# Patient Record
Sex: Male | Born: 1985 | Race: White | Hispanic: No | Marital: Married | Smoking: Never smoker
Health system: Southern US, Community
[De-identification: ages and names within clinical notes are randomized; demographics above are authoritative.]

## PROBLEM LIST (undated history)

## (undated) HISTORY — PX: WISDOM TOOTH EXTRACTION: SHX21

## (undated) HISTORY — PX: HERNIA REPAIR: SHX51

---

## 2012-07-13 ENCOUNTER — Emergency Department (HOSPITAL_COMMUNITY): Payer: BC Managed Care – PPO

## 2012-07-13 ENCOUNTER — Emergency Department (HOSPITAL_COMMUNITY)
Admission: EM | Admit: 2012-07-13 | Discharge: 2012-07-13 | Disposition: A | Discharge status: Home / Self Care | Payer: BC Managed Care – PPO | Attending: Emergency Medicine | Admitting: Emergency Medicine

## 2012-07-13 ENCOUNTER — Encounter (HOSPITAL_COMMUNITY): Payer: Self-pay | Admitting: *Deleted

## 2012-07-13 DIAGNOSIS — M940 Chondrocostal junction syndrome [Tietze]: Secondary | ICD-10-CM | POA: Insufficient documentation

## 2012-07-13 DIAGNOSIS — R197 Diarrhea, unspecified: Secondary | ICD-10-CM | POA: Insufficient documentation

## 2012-07-13 LAB — POCT I-STAT, CHEM 8
Creatinine, Ser: 1.2 mg/dL (ref 0.50–1.35)
Glucose, Bld: 107 mg/dL — ABNORMAL HIGH (ref 70–99)
Hemoglobin: 15.6 g/dL (ref 13.0–17.0)
Sodium: 139 mEq/L (ref 135–145)
TCO2: 29 mmol/L (ref 0–100)

## 2012-07-13 MED ORDER — METHOCARBAMOL 500 MG PO TABS: 500.0000 mg | ORAL_TABLET | Freq: Two times a day (BID) | ORAL | Status: DC | PRN

## 2012-07-13 NOTE — ED Provider Notes (Signed)
Medical screening examination/treatment/procedure(s) were performed by non-physician practitioner and as supervising physician I was immediately available for consultation/collaboration.   Darvis Croft, MD 07/13/12 2345 

## 2012-07-13 NOTE — ED Notes (Signed)
Pt c/o left sided rib pain since Saturday. Denies any injury, states he just woke up with pain. Movement and deep inspiration makes pain worse. States nothing makes pain better. Pt states that pain is not worse with palpation and does not radiate.

## 2012-07-13 NOTE — ED Provider Notes (Signed)
History     CSN: [id number]  Arrival date & time 07/13/12  2009   First MD Initiated Contact with Patient 07/13/12 2116      Chief Complaint  Patient presents with  . Rib pain     (Consider location/radiation/quality/duration/timing/severity/associated sxs/prior treatment) HPI Comments: This is a 27 year old patient who presents today with sudden onset non radiating rib pain since Saturday. He does not recall any trauma or any extra lifting. The pain is a dull pain when he does not move. It is a sharper pain when he breaths in. He has had some loose stools recently without blood. He denies any other recent illness. Denies SOB, nausea, abdominal pain, weakness, or diaphoresis. No recent long trips, no leg swelling. He has never had pain like this before.   The history is provided by the patient. No language interpreter was used.    History reviewed. No pertinent past medical history.  Past Surgical History  Procedure Laterality Date  . Wisdom tooth extraction      History reviewed. No pertinent family history.  History  Substance Use Topics  . Smoking status: Never Smoker   . Smokeless tobacco: Not on file  . Alcohol Use: No      Review of Systems  Constitutional: Negative for fever and chills.  Respiratory: Negative for chest tightness and shortness of breath.   Cardiovascular: Negative for chest pain.  Gastrointestinal: Positive for diarrhea ("a couple loose stools"). Negative for nausea, vomiting, abdominal pain and constipation.    Allergies  Review of patient's allergies indicates no known allergies.  Home Medications  No current outpatient prescriptions on file.  BP 148/72  Pulse 73  Temp(Src) 97.5 F (36.4 C) (Oral)  Resp 12  SpO2 100%  Physical Exam  Nursing note and vitals reviewed. Constitutional: He is oriented to person, place, and time. He appears well-developed and well-nourished. No distress.  HENT:  Head: Normocephalic and atraumatic.   Right Ear: External ear normal.  Left Ear: External ear normal.  Nose: Nose normal.  Eyes: Conjunctivae are normal.  Neck: Normal range of motion. No tracheal deviation present.  Cardiovascular: Normal rate, regular rhythm, normal heart sounds, intact distal pulses and normal pulses.   Pulmonary/Chest: Effort normal and breath sounds normal. No stridor. He exhibits no mass, no tenderness, no deformity and no swelling.  Abdominal: Soft. He exhibits no distension. There is no tenderness.  Musculoskeletal: Normal range of motion.  Neurological: He is alert and oriented to person, place, and time.  Skin: Skin is warm and dry. He is not diaphoretic.  Psychiatric: He has a normal mood and affect. His behavior is normal.    ED Course  Procedures (including critical care time)  Labs Reviewed  POCT I-STAT, CHEM 8 - Abnormal; Notable for the following:    BUN 28 (*)    Glucose, Bld 107 (*)    All other components within normal limits  POCT I-STAT TROPONIN I   Dg Chest 2 View  07/13/2012  *RADIOLOGY REPORT*  Clinical Data: Left-sided chest pain.  CHEST - 2 VIEW  Comparison: None.  Findings: The heart size is normal.  The lungs are clear.  A pectus excavatum deformity is evident.  There is slight curvature of the thoracic spine.  IMPRESSION: No acute cardiopulmonary disease.   Original Report Authenticated By: Marin Roberts, M.D.     Date: 07/13/2012  Rate: 64  Rhythm: normal sinus rhythm  QRS Axis: normal  Intervals: normal  ST/T Wave abnormalities:  normal  Conduction Disutrbances:none  Narrative Interpretation:   Old EKG Reviewed: none available   1. Acute costochondritis       MDM  Patient came in present with rib pain. Dull pain with pleuritic qualities. PERC negative. Vital signs WNL - no tachycardia. CXR shows no acute process. EKG normal. Troponin negative. O2 sats remained 100% on RA through ED course. Suspect costochondritis. Return instructions given. Patient will  follow up with PCP. Patient / Family / Caregiver informed of clinical course, understand medical decision-making process, and agree with plan.        Mora Bellman, PA-C 07/13/12 2216

## 2012-07-13 NOTE — ED Notes (Addendum)
TRIAGE BY CHRISTA RN

## 2012-07-13 NOTE — ED Notes (Signed)
Pt c/o left sided rib pain since Saturday.  Also c/o diarrhea.  Denies SOB, nausea, weakness, diaphoresis.

## 2013-10-15 ENCOUNTER — Emergency Department (HOSPITAL_BASED_OUTPATIENT_CLINIC_OR_DEPARTMENT_OTHER)
Admission: EM | Admit: 2013-10-15 | Discharge: 2013-10-15 | Disposition: A | Discharge status: Home / Self Care | Payer: BC Managed Care – PPO | Attending: Emergency Medicine | Admitting: Emergency Medicine

## 2013-10-15 ENCOUNTER — Encounter (HOSPITAL_BASED_OUTPATIENT_CLINIC_OR_DEPARTMENT_OTHER): Payer: Self-pay | Admitting: Emergency Medicine

## 2013-10-15 DIAGNOSIS — R55 Syncope and collapse: Secondary | ICD-10-CM | POA: Insufficient documentation

## 2013-10-15 LAB — COMPREHENSIVE METABOLIC PANEL
ALK PHOS: 74 U/L (ref 39–117)
ALT: 17 U/L (ref 0–53)
ANION GAP: 13 (ref 5–15)
AST: 15 U/L (ref 0–37)
Albumin: 3.9 g/dL (ref 3.5–5.2)
BILIRUBIN TOTAL: 0.3 mg/dL (ref 0.3–1.2)
BUN: 21 mg/dL (ref 6–23)
CHLORIDE: 103 meq/L (ref 96–112)
CO2: 26 meq/L (ref 19–32)
Calcium: 9.1 mg/dL (ref 8.4–10.5)
Creatinine, Ser: 1.2 mg/dL (ref 0.50–1.35)
GFR calc non Af Amer: 80 mL/min — ABNORMAL LOW (ref >90)
GLUCOSE: 103 mg/dL — AB (ref 70–99)
POTASSIUM: 4.2 meq/L (ref 3.7–5.3)
SODIUM: 142 meq/L (ref 137–147)
TOTAL PROTEIN: 7.1 g/dL (ref 6.0–8.3)

## 2013-10-15 LAB — CBC WITH DIFFERENTIAL/PLATELET
Basophils Absolute: 0 10*3/uL (ref 0.0–0.1)
Basophils Relative: 0 % (ref 0–1)
EOS ABS: 0 10*3/uL (ref 0.0–0.7)
Eosinophils Relative: 0 % (ref 0–5)
HCT: 43.4 % (ref 39.0–52.0)
HEMOGLOBIN: 14.9 g/dL (ref 13.0–17.0)
LYMPHS ABS: 1.7 10*3/uL (ref 0.7–4.0)
LYMPHS PCT: 21 % (ref 12–46)
MCH: 29.8 pg (ref 26.0–34.0)
MCHC: 34.3 g/dL (ref 30.0–36.0)
MCV: 86.8 fL (ref 78.0–100.0)
MONOS PCT: 7 % (ref 3–12)
Monocytes Absolute: 0.6 10*3/uL (ref 0.1–1.0)
NEUTROS ABS: 5.5 10*3/uL (ref 1.7–7.7)
NEUTROS PCT: 71 % (ref 43–77)
PLATELETS: 253 10*3/uL (ref 150–400)
RBC: 5 MIL/uL (ref 4.22–5.81)
RDW: 12.3 % (ref 11.5–15.5)
WBC: 7.8 10*3/uL (ref 4.0–10.5)

## 2013-10-15 MED ORDER — SODIUM CHLORIDE 0.9 % IV SOLN
Freq: Once | INTRAVENOUS | Status: AC
  Administered 2013-10-15: 15:00:00 via INTRAVENOUS

## 2013-10-15 NOTE — ED Provider Notes (Addendum)
CSN: [id number]     Arrival date & time 10/15/13  1117 History   First MD Initiated Contact with Patient 10/15/13 1256     Chief Complaint  Patient presents with  . Dizziness     (Consider location/radiation/quality/duration/timing/severity/associated sxs/prior Treatment) Patient is a 28 y.o. male presenting with weakness. The history is provided by the patient. No language interpreter was used.  Weakness This is a new problem. The problem occurs constantly. The problem has been unchanged. Associated symptoms include fatigue and weakness. Nothing aggravates the symptoms. He has tried nothing for the symptoms. The treatment provided no relief.    History reviewed. No pertinent past medical history. Past Surgical History  Procedure Laterality Date  . Wisdom tooth extraction     No family history on file. History  Substance Use Topics  . Smoking status: Never Smoker   . Smokeless tobacco: Not on file  . Alcohol Use: No    Review of Systems  Constitutional: Positive for fatigue.  Neurological: Positive for weakness.  All other systems reviewed and are negative.     Allergies  Review of patient's allergies indicates no known allergies.  Home Medications   Prior to Admission medications   Not on File   BP 124/71  Pulse 61  Temp(Src) 97.8 F (36.6 C) (Oral)  Resp 16  Ht 5\' 7"  (1.702 m)  Wt 150 lb (68.04 kg)  BMI 23.49 kg/m2  SpO2 100% Physical Exam  Nursing note and vitals reviewed. Constitutional: He is oriented to person, place, and time. He appears well-developed and well-nourished.  HENT:  Head: Normocephalic.  No sign of abscess,   Eyes: Conjunctivae and EOM are normal. Pupils are equal, round, and reactive to light.  Neck: Normal range of motion.  Cardiovascular: Normal rate and normal heart sounds.   Pulmonary/Chest: Effort normal.  Abdominal: Soft. He exhibits no distension.  Musculoskeletal: Normal range of motion.  Neurological: He is alert and  oriented to person, place, and time.  Skin: Skin is warm.  Psychiatric: He has a normal mood and affect.    ED Course  Procedures (including critical care time) Labs Review Labs Reviewed  COMPREHENSIVE METABOLIC PANEL - Abnormal; Notable for the following:    Glucose, Bld 103 (*)    GFR calc non Af Amer 80 (*)    All other components within normal limits  CBC WITH DIFFERENTIAL    Imaging Review No results found.   EKG Interpretation None      Results for orders placed during the hospital encounter of 10/15/13  CBC WITH DIFFERENTIAL      Result Value Ref Range   WBC 7.8  4.0 - 10.5 K/uL   RBC 5.00  4.22 - 5.81 MIL/uL   Hemoglobin 14.9  13.0 - 17.0 g/dL   HCT 13.043.4  86.539.0 - 78.452.0 %   MCV 86.8  78.0 - 100.0 fL   MCH 29.8  26.0 - 34.0 pg   MCHC 34.3  30.0 - 36.0 g/dL   RDW 69.612.3  29.511.5 - 28.415.5 %   Platelets 253  150 - 400 K/uL   Neutrophils Relative % 71  43 - 77 %   Neutro Abs 5.5  1.7 - 7.7 K/uL   Lymphocytes Relative 21  12 - 46 %   Lymphs Abs 1.7  0.7 - 4.0 K/uL   Monocytes Relative 7  3 - 12 %   Monocytes Absolute 0.6  0.1 - 1.0 K/uL   Eosinophils Relative 0  0 -  5 %   Eosinophils Absolute 0.0  0.0 - 0.7 K/uL   Basophils Relative 0  0 - 1 %   Basophils Absolute 0.0  0.0 - 0.1 K/uL  COMPREHENSIVE METABOLIC PANEL      Result Value Ref Range   Sodium 142  137 - 147 mEq/L   Potassium 4.2  3.7 - 5.3 mEq/L   Chloride 103  96 - 112 mEq/L   CO2 26  19 - 32 mEq/L   Glucose, Bld 103 (*) 70 - 99 mg/dL   BUN 21  6 - 23 mg/dL   Creatinine, Ser 1.611.20  0.50 - 1.35 mg/dL   Calcium 9.1  8.4 - 09.610.5 mg/dL   Total Protein 7.1  6.0 - 8.3 g/dL   Albumin 3.9  3.5 - 5.2 g/dL   AST 15  0 - 37 U/L   ALT 17  0 - 53 U/L   Alkaline Phosphatase 74  39 - 117 U/L   Total Bilirubin 0.3  0.3 - 1.2 mg/dL   GFR calc non Af Amer 80 (*) >90 mL/min   GFR calc Af Amer >90  >90 mL/min   Anion gap 13  5 - 15   No results found.  Normal sinus,  Right axis deviation other wise normal MDM   Final  diagnoses:  Near syncope        Elson AreasLeslie K Silviano Neuser, PA-C 10/15/13 1619  Lonia SkinnerLeslie K McBeeSofia, PA-C 11/06/13 1022

## 2013-10-15 NOTE — ED Notes (Signed)
Patient here with reported acute onset of dizziness while at grocery store today. Worse with more activity and nausea. Has had left ear fullness and swollen lymph nodes for past week

## 2013-10-15 NOTE — Discharge Instructions (Signed)
Near-Syncope Near-syncope (commonly known as near fainting) is sudden weakness, dizziness, or feeling like you might pass out. During an episode of near-syncope, you may also develop pale skin, have tunnel vision, or feel sick to your stomach (nauseous). Near-syncope may occur when getting up after sitting or while standing for a long time. It is caused by a sudden decrease in blood flow to the brain. This decrease can result from various causes or triggers, most of which are not serious. However, because near-syncope can sometimes be a sign of something serious, a medical evaluation is required. The specific cause is often not determined. HOME CARE INSTRUCTIONS  Monitor your condition for any changes. The following actions may help to alleviate any discomfort you are experiencing:  Have someone stay with you until you feel stable.  Lie down right away and prop your feet up if you start feeling like you might faint. Breathe deeply and steadily. Wait until all the symptoms have passed. Most of these episodes last only a few minutes. You may feel tired for several hours.   Drink enough fluids to keep your urine clear or pale yellow.   If you are taking blood pressure or heart medicine, get up slowly when seated or lying down. Take several minutes to sit and then stand. This can reduce dizziness.  Follow up with your health care provider as directed. SEEK IMMEDIATE MEDICAL CARE IF:   You have a severe headache.   You have unusual pain in the chest, abdomen, or back.   You are bleeding from the mouth or rectum, or you have black or tarry stool.   You have an irregular or very fast heartbeat.   You have repeated fainting or have seizure-like jerking during an episode.   You faint when sitting or lying down.   You have confusion.   You have difficulty walking.   You have severe weakness.   You have vision problems.  MAKE SURE YOU:   Understand these instructions.  Will  watch your condition.  Will get help right away if you are not doing well or get worse. Document Released: 03/30/2005 Document Revised: 04/04/2013 Document Reviewed: 09/02/2012 Exit[employer] Patient Information 2015 [employer]. This information is not intended to replace advice given to you by your health care provider. Make sure you discuss any questions you have with your health care provider.  

## 2013-10-18 NOTE — ED Provider Notes (Signed)
Medical screening examination/treatment/procedure(s) were performed by non-physician practitioner and as supervising physician I was immediately available for consultation/collaboration.    Courtlynn Holloman L Kord Monette, MD 10/18/13 1507 

## 2013-11-20 NOTE — ED Provider Notes (Signed)
Medical screening examination/treatment/procedure(s) were performed by non-physician practitioner and as supervising physician I was immediately available for consultation/collaboration.   Nelia Shiobert L Bhavin Monjaraz, MD 11/20/13 [phone]

## 2014-02-13 DIAGNOSIS — R590 Localized enlarged lymph nodes: Secondary | ICD-10-CM | POA: Insufficient documentation

## 2014-02-13 DIAGNOSIS — H9201 Otalgia, right ear: Secondary | ICD-10-CM | POA: Insufficient documentation

## 2014-02-13 DIAGNOSIS — R6882 Decreased libido: Secondary | ICD-10-CM | POA: Insufficient documentation

## 2014-02-13 DIAGNOSIS — N529 Male erectile dysfunction, unspecified: Secondary | ICD-10-CM | POA: Insufficient documentation

## 2014-02-13 DIAGNOSIS — N509 Disorder of male genital organs, unspecified: Secondary | ICD-10-CM | POA: Insufficient documentation

## 2014-02-13 DIAGNOSIS — R5383 Other fatigue: Secondary | ICD-10-CM | POA: Insufficient documentation

## 2015-03-15 ENCOUNTER — Encounter (HOSPITAL_BASED_OUTPATIENT_CLINIC_OR_DEPARTMENT_OTHER): Payer: Self-pay | Admitting: Emergency Medicine

## 2015-03-15 ENCOUNTER — Emergency Department (HOSPITAL_BASED_OUTPATIENT_CLINIC_OR_DEPARTMENT_OTHER)
Admission: EM | Admit: 2015-03-15 | Discharge: 2015-03-15 | Disposition: A | Discharge status: Home / Self Care | Payer: BLUE CROSS/BLUE SHIELD | Attending: Emergency Medicine | Admitting: Emergency Medicine

## 2015-03-15 DIAGNOSIS — M25572 Pain in left ankle and joints of left foot: Secondary | ICD-10-CM | POA: Diagnosis present

## 2015-03-15 MED ORDER — NAPROXEN 250 MG PO TABS
500.0000 mg | ORAL_TABLET | Freq: Once | ORAL | Status: AC
  Administered 2015-03-15: 500 mg via ORAL
  Filled 2015-03-15: qty 2

## 2015-03-15 MED ORDER — NAPROXEN SODIUM 550 MG PO TABS: ORAL_TABLET | ORAL | Status: DC

## 2015-03-15 NOTE — ED Notes (Signed)
Patient is alert and oriented x3.  He was given DC instructions and follow up visit instructions.  Patient gave verbal understanding.  He was DC ambulatory under his own power to home.  V/S stable.  He was not showing any signs of distress on DC 

## 2015-03-15 NOTE — ED Notes (Signed)
No known injury. Progressive swelling and pain to medial aspect of left foot.

## 2015-03-15 NOTE — ED Provider Notes (Addendum)
CSN: [id number]     Arrival date & time 03/15/15  0231 History   First MD Initiated Contact with Patient 03/15/15 [phone]     Chief Complaint  Patient presents with  . Ankle Pain     (Consider location/radiation/quality/duration/timing/severity/associated sxs/prior Treatment) HPI  This is a 29 year old male with several week history of intermittent pain about his left medial malleolus. He denies trauma. The pain acutely worsened the past 2 days and has not abated like previous episodes. He rates his pain as a 7 out of 10. It is worse with movement or palpation. He has had no systemic symptoms such as fever or chills.  History reviewed. No pertinent past medical history. Past Surgical History  Procedure Laterality Date  . Wisdom tooth extraction     No family history on file. Social History  Substance Use Topics  . Smoking status: Never Smoker   . Smokeless tobacco: None  . Alcohol Use: No    Review of Systems  All other systems reviewed and are negative.   Allergies  Review of patient's allergies indicates no known allergies.  Home Medications   Prior to Admission medications   Medication Sig Start Date End Date Taking? Authorizing Provider  naproxen sodium (ANAPROX DS) 550 MG tablet Take 1 tablet twice daily as needed for ankle pain. Best taken with a meal. 03/15/15   Jayden Kratochvil, MD   BP 125/81 mmHg  Pulse 70  SpO2 98%   Physical Exam  General: Well-developed, well-nourished male in no acute distress; appearance consistent with age of record HENT: normocephalic; atraumatic Eyes: Normal appearance Neck: supple Heart: regular rate and rhythm Lungs: Normal respiratory effort and excursion Abdomen: soft; nondistended Extremities: No deformity; full range of motion; pulses normal; tenderness and erythema of left medial malleolus without significant warmth, foot distally neurovascularly intact with intact tendon function:   Neurologic: Awake, alert and oriented; motor  function intact in all extremities and symmetric; no facial droop Skin: Warm and dry Psychiatric: Normal mood and affect    ED Course  Procedures (including critical care time)   MDM  We'll treat with anti-inflammatories and provide an ASO. Will refer to sports medicine for further evaluation.  Paula LibraJohn Ryu Cerreta, MD 03/15/15 [phone]  Paula LibraJohn Jazlin Tapscott, MD 03/15/15 [phone]

## 2016-09-01 ENCOUNTER — Emergency Department (HOSPITAL_BASED_OUTPATIENT_CLINIC_OR_DEPARTMENT_OTHER)
Admission: EM | Admit: 2016-09-01 | Discharge: 2016-09-02 | Disposition: A | Discharge status: Home / Self Care | Payer: BLUE CROSS/BLUE SHIELD | Attending: Emergency Medicine | Admitting: Emergency Medicine

## 2016-09-01 ENCOUNTER — Encounter (HOSPITAL_BASED_OUTPATIENT_CLINIC_OR_DEPARTMENT_OTHER): Payer: Self-pay | Admitting: Emergency Medicine

## 2016-09-01 DIAGNOSIS — R51 Headache: Secondary | ICD-10-CM

## 2016-09-01 DIAGNOSIS — J069 Acute upper respiratory infection, unspecified: Secondary | ICD-10-CM | POA: Insufficient documentation

## 2016-09-01 DIAGNOSIS — R519 Headache, unspecified: Secondary | ICD-10-CM

## 2016-09-01 DIAGNOSIS — B9789 Other viral agents as the cause of diseases classified elsewhere: Secondary | ICD-10-CM

## 2016-09-01 NOTE — ED Triage Notes (Signed)
Pt c/o HA,cough, congestion x 5 days. Pt was seen at PM earlier today, dx with virus. Pt taking mucinex DM and pseudofed without relief.

## 2016-09-02 MED ORDER — ACETAMINOPHEN 325 MG PO TABS
650.0000 mg | ORAL_TABLET | Freq: Once | ORAL | Status: AC
  Administered 2016-09-02: 650 mg via ORAL
  Filled 2016-09-02: qty 2

## 2016-09-02 NOTE — ED Provider Notes (Signed)
[facility] Provider Note   CSN: [id number] Arrival date & time: 09/01/16  2249     History   Chief Complaint Chief Complaint  Patient presents with  . URI    HPI Christopher Perkins is a 31 y.o. male.  Patient reports with congestion, sinus headache, cough 5 days. Patient was seen at primary care earlier today, diagnosed with viral URI, instructed to take Mucinex and Zyrtec. Patient reports to ER stating he is not feeling any better after taking Mucinex. Reports cough as dry. States he has sinus pressure and headache that is located in his face and forehead. Denies sore throat, ear pain, fever, nausea/vomiting, abdominal pain, shortness of breath, chest pain, difficulty swallowing or breathing.       History reviewed. No pertinent past medical history.  There are no active problems to display for this patient.   Past Surgical History:  Procedure Laterality Date  . WISDOM TOOTH EXTRACTION         Home Medications    Prior to Admission medications   Medication Sig Start Date End Date Taking? Authorizing Provider  dextromethorphan-guaiFENesin (MUCINEX DM) 30-600 MG 12hr tablet Take 1 tablet by mouth 2 (two) times daily.   Yes [provider]  naproxen sodium (ANAPROX DS) 550 MG tablet Take 1 tablet twice daily as needed for ankle pain. Best taken with a meal. 03/15/15   Molpus, John, MD    Family History No family history on file.  Social History Social History  Substance Use Topics  . Smoking status: Never Smoker  . Smokeless tobacco: Never Used  . Alcohol use No     Allergies   Patient has no known allergies.   Review of Systems Review of Systems  Constitutional: Negative for fever.  HENT: Positive for congestion, sinus pain and sinus pressure. Negative for ear pain, sore throat and trouble swallowing.   Respiratory: Positive for cough. Negative for shortness of breath.   Cardiovascular: Negative for chest pain.  Gastrointestinal:  Negative for abdominal pain, nausea and vomiting.  Neurological: Positive for headaches.     Physical Exam Updated Vital Signs BP (!) 134/94   Pulse 79   Temp 99.1 F (37.3 C) (Oral)   Resp 18   Ht 5\' 7"  (1.702 m)   Wt 65.8 kg (145 lb)   SpO2 98%   BMI 22.71 kg/m   Physical Exam  Constitutional: He appears well-developed and well-nourished.  HENT:  Head: Normocephalic and atraumatic.  Right Ear: Tympanic membrane, external ear and ear canal normal.  Left Ear: Tympanic membrane, external ear and ear canal normal.  Nose: Right sinus exhibits maxillary sinus tenderness. Right sinus exhibits no frontal sinus tenderness. Left sinus exhibits maxillary sinus tenderness. Left sinus exhibits no frontal sinus tenderness.  Mouth/Throat: Uvula is midline, oropharynx is clear and moist and mucous membranes are normal. No trismus in the jaw. No uvula swelling. No posterior oropharyngeal erythema. No tonsillar exudate.  Eyes: Conjunctivae are normal.  Cardiovascular: Normal rate, regular rhythm, normal heart sounds and intact distal pulses.  Exam reveals no friction rub.   No murmur heard. Pulmonary/Chest: Effort normal and breath sounds normal. No respiratory distress. He has no wheezes. He has no rales.  Psychiatric: He has a normal mood and affect. His behavior is normal.  Nursing note and vitals reviewed.    ED Treatments / Results  Labs (all labs ordered are listed, but only abnormal results are displayed) Labs Reviewed - No data to display  EKG  EKG Interpretation None       Radiology No results found.  Procedures Procedures (including critical care time)  Medications Ordered in ED Medications  acetaminophen (TYLENOL) tablet 650 mg (650 mg Oral Given 09/02/16 0032)     Initial Impression / Assessment and Plan / ED Course  I have reviewed the triage vital signs and the nursing notes.  Pertinent labs & imaging results that were available during my care of the patient  were reviewed by me and considered in my medical decision making (see chart for details).      Patients symptoms are consistent with URI, likely viral etiology. Patient was sinus pressure and sinus headache. Lungs CTAB, uvula is midline, no trismus patient afebrile. Discussed that antibiotics are not indicated for viral infections. Discussed additional OTC treatment for congestion and sinus pressure. Encouraged patient to drink plenty of water with Mucinex. He can take Advil as needed for headache. Pt will be discharged with symptomatic treatment. Pt is hemodynamically stable & in NAD prior to dc.   Discussed results, findings, treatment and follow up. Patient advised of return precautions. Patient verbalized understanding and agreed with plan.    Final Clinical Impressions(s) / ED Diagnoses   Final diagnoses:  Viral URI with cough  Sinus headache    New Prescriptions Discharge Medication List as of 09/02/2016 12:24 AM       Russo, [place] N, PA-C 09/02/16 0101    Shon Baton, MD 09/02/16 [phone]

## 2016-09-02 NOTE — Discharge Instructions (Signed)
Please read instructions below.  You can take tylenol or advil as needed for headache or body aches. Drink plenty of water.  You can continue taking mucinex. You can use flonase (steroid nasal spray) for your sinus pressure. If you use afrin, do not use more than 4 days. Follow up with your primary care provider as needed.  Return to the ER for difficulty swallowing liquids, difficulty breathing, or new or worsening symptoms.

## 2017-12-02 DIAGNOSIS — G43709 Chronic migraine without aura, not intractable, without status migrainosus: Secondary | ICD-10-CM | POA: Insufficient documentation

## 2017-12-02 DIAGNOSIS — G43009 Migraine without aura, not intractable, without status migrainosus: Secondary | ICD-10-CM | POA: Insufficient documentation

## 2018-01-07 ENCOUNTER — Other Ambulatory Visit: Payer: Self-pay

## 2018-01-07 ENCOUNTER — Encounter (HOSPITAL_BASED_OUTPATIENT_CLINIC_OR_DEPARTMENT_OTHER): Payer: Self-pay | Admitting: Emergency Medicine

## 2018-01-07 ENCOUNTER — Emergency Department (HOSPITAL_BASED_OUTPATIENT_CLINIC_OR_DEPARTMENT_OTHER)
Admission: EM | Admit: 2018-01-07 | Discharge: 2018-01-07 | Disposition: A | Discharge status: Home / Self Care | Payer: BLUE CROSS/BLUE SHIELD | Attending: Emergency Medicine | Admitting: Emergency Medicine

## 2018-01-07 DIAGNOSIS — N289 Disorder of kidney and ureter, unspecified: Secondary | ICD-10-CM | POA: Diagnosis not present

## 2018-01-07 DIAGNOSIS — R002 Palpitations: Secondary | ICD-10-CM | POA: Insufficient documentation

## 2018-01-07 LAB — BASIC METABOLIC PANEL
Anion gap: 9 (ref 5–15)
BUN: 25 mg/dL — AB (ref 6–20)
CHLORIDE: 101 mmol/L (ref 98–111)
CO2: 28 mmol/L (ref 22–32)
CREATININE: 1.32 mg/dL — AB (ref 0.61–1.24)
Calcium: 9 mg/dL (ref 8.9–10.3)
GFR calc non Af Amer: >60 mL/min (ref >60)
Glucose, Bld: 141 mg/dL — ABNORMAL HIGH (ref 70–99)
Potassium: 4.2 mmol/L (ref 3.5–5.1)
SODIUM: 138 mmol/L (ref 135–145)

## 2018-01-07 NOTE — Discharge Instructions (Addendum)
Today  your creatinine is 1.3.  This may be due to dehydration.  Be sure  to have your creatinine rechecked in 6 months.

## 2018-01-07 NOTE — ED Notes (Signed)
ED Provider at bedside. 

## 2018-01-07 NOTE — ED Triage Notes (Signed)
Pt c/o muscle spasms and heart was beating fast while laying in bed.

## 2018-01-07 NOTE — ED Provider Notes (Signed)
[facility] [place] EMERGENCY DEPARTMENT Provider Note   CSN: [id number] Arrival date & time: 01/07/18  0516     History   Chief Complaint Chief Complaint  Patient presents with  . Palpitations  . Spasms    HPI Christopher Perkins is a 32 y.o. male.  The history is provided by the patient.  Palpitations   This is a new problem. The current episode started less than 1 hour ago. The problem occurs constantly. The problem has not changed since onset.Pertinent negatives include no fever, no chest pain, no syncope and no shortness of breath. He has tried nothing for the symptoms. There are no known risk factors.  Patient reports while he was lying in bed he noticed his heart was racing.  He stood up and felt that his whole body was tremoring and muscle spasms.  No syncope.  No chest pain/shortness of breath No drug abuse reported.  He does not drink caffeine.  When he went to bed he felt well. He has no other medical conditions.    PMH-none Soc hx - no drug abuse, no caffeine use Past Surgical History:  Procedure Laterality Date  . WISDOM TOOTH EXTRACTION          Home Medications    Prior to Admission medications   Not on File    Family History No family history on file.  Social History Social History   Tobacco Use  . Smoking status: Never Smoker  . Smokeless tobacco: Never Used  Substance Use Topics  . Alcohol use: No  . Drug use: No     Allergies   Patient has no known allergies.   Review of Systems Review of Systems  Constitutional: Negative for fever.  Respiratory: Negative for shortness of breath.   Cardiovascular: Positive for palpitations. Negative for chest pain and syncope.  Gastrointestinal: Negative for blood in stool.  Musculoskeletal: Positive for myalgias.  Neurological: Negative for syncope.  All other systems reviewed and are negative.    Physical Exam Updated Vital Signs BP (!) 145/96 (BP Location: Right Arm)   Pulse (!) 118    Temp 97.6 F (36.4 C) (Oral)   Resp 18   Ht 1.702 m (5\' 7" )   Wt 65.8 kg   SpO2 100%   BMI 22.71 kg/m   Physical Exam CONSTITUTIONAL: Well developed/well nourished HEAD: Normocephalic/atraumatic EYES: EOMI/PERRL ENMT: Mucous membranes moist NECK: supple no meningeal signs SPINE/BACK:entire spine nontender CV: S1/S2 noted, no murmurs/rubs/gallops noted LUNGS: Lungs are clear to auscultation bilaterally, no apparent distress ABDOMEN: soft, nontender, no rebound or guarding, bowel sounds noted throughout abdomen GU:no cva tenderness NEURO: Pt is awake/alert/appropriate, moves all extremitiesx4.  No facial droop.   EXTREMITIES: pulses normal/equal, full ROM, no lower extremity edema or tenderness SKIN: warm, color normal PSYCH: Mildly anxious   ED Treatments / Results  Labs (all labs ordered are listed, but only abnormal results are displayed) Labs Reviewed  BASIC METABOLIC PANEL - Abnormal; Notable for the following components:      Result Value   Glucose, Bld 141 (*)    BUN 25 (*)    Creatinine, Ser 1.32 (*)    All other components within normal limits    EKG EKG Interpretation  Date/Time:  Friday January 07 2018 05:32:37 EDT Ventricular Rate:  98 PR Interval:    QRS Duration: 100 QT Interval:  345 QTC Calculation: 441 R Axis:   81 Text Interpretation:  Sinus rhythm Baseline wander in lead(s) V2 Confirmed by Zadie Rhine (  [id number]) on 01/07/2018 5:43:19 AM   Radiology No results found.  Procedures Procedures (including critical care time)  Medications Ordered in ED Medications - No data to display   Initial Impression / Assessment and Plan / ED Course  I have reviewed the triage vital signs and the nursing notes.  Pertinent labs  results that were available during my care of the patient were reviewed by me and considered in my medical decision making (see chart for details).     5:45 AM Patient very well-appearing.  Initial EKG reveals sinus  rhythm.  Low suspicion for dysrhythmia.  Will check electrolytes to ensure there is no abnormality. 6:22 AM Patient improved.  Heart rate and blood pressure appear improved.  We discussed lab findings.  I advised him of his creatinine 1.3 and need to have this rechecked in the next 6 months.  Overall he is well-appearing.  We discussed strict return precautions Final Clinical Impressions(s) / ED Diagnoses   Final diagnoses:  Palpitations  Renal insufficiency    ED Discharge Orders    None       Zadie Rhine, MD 01/07/18 [phone]

## 2018-04-18 DIAGNOSIS — I471 Supraventricular tachycardia: Secondary | ICD-10-CM | POA: Insufficient documentation

## 2018-04-18 DIAGNOSIS — F419 Anxiety disorder, unspecified: Secondary | ICD-10-CM | POA: Insufficient documentation

## 2018-06-20 ENCOUNTER — Encounter (INDEPENDENT_AMBULATORY_CARE_PROVIDER_SITE_OTHER): Payer: Self-pay | Admitting: Family Medicine

## 2018-06-20 ENCOUNTER — Ambulatory Visit (INDEPENDENT_AMBULATORY_CARE_PROVIDER_SITE_OTHER): Payer: BLUE CROSS/BLUE SHIELD | Admitting: Family Medicine

## 2018-06-20 VITALS — BP 120/74 | HR 66 | Ht 66.0 in | Wt 143.5 lb

## 2018-06-20 DIAGNOSIS — R7989 Other specified abnormal findings of blood chemistry: Secondary | ICD-10-CM

## 2018-06-20 DIAGNOSIS — R002 Palpitations: Secondary | ICD-10-CM | POA: Diagnosis not present

## 2018-06-20 DIAGNOSIS — R5383 Other fatigue: Secondary | ICD-10-CM

## 2018-06-20 DIAGNOSIS — R5382 Chronic fatigue, unspecified: Secondary | ICD-10-CM | POA: Diagnosis not present

## 2018-06-20 NOTE — Progress Notes (Signed)
Office Visit Note   Patient: Christopher Perkins           Date of Birth: 08/28/1985           MRN: [id number] Visit Date: 06/20/2018 Requested by: Juanita Laster, MD [address] [place], [place] [address] PCP: Juanita Laster, MD  Subjective: Chief Complaint  Patient presents with  . consult    HPI: He is here with chronic fatigue.  Symptoms started about a year and a half ago, he cannot pinpoint what triggered it.  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  He has been doing this for about 7 years.  It is very stressful but he is used to the stress.  He got married 2 years ago but that was a happy time for him and his marriage is going well.  He does not think that marriage stress is contributing.  He has been to a medical doctor and to a functional medicine provider.  He was told that his testosterone level was low and was given injectable testosterone but chose not to proceed with that.  He was told that he had systemic fungal infection and was given a prescription for that but did not take it, he was concerned that they were not addressing the underlying source of his problems but just trying to treat symptoms.  On his own he made some dietary changes, he eliminated gluten and dairy and is eating overall very healthfully.  He gets occasional abdominal bloating but his symptoms are better on his diet.  His labs were notable for elevated TSH on a couple occasions, one level was as high as 4.9.  His thyroid antibodies were negative and his T3 and T4 levels were acceptable as well as his reverse T3.  His mother has had borderline thyroid troubles but there is nobody in the family with autoimmune thyroid disease to his knowledge.  He has had intermittent troubles with palpitations over the years.  At one point he was treated with an SSRI and he wonders whether that might have made some of his other symptoms worse.  He is no longer on  medication for that.  His creatinine level has been consistently slightly elevated over the years.  He drinks a lot of water to try to stay hydrated.  It has not helped his creatinine levels to improve, but it does cause some urinary urgency at times.  He states that his sinuses seem to stay chronically congested, especially on the left side.  He gets a sinus infection about twice per year.  He has not been tested for allergies but he takes occasional allergy medicine when his symptoms are more severe.  He uses a sinus wash when his symptoms are more severe.               ROS: Other systems were reviewed and are negative.  Objective: Vital Signs: BP 120/74 (BP Location: Left Arm, Patient Position: Sitting, Cuff Size: Normal)   Pulse 66   Ht 5\' 6"  (1.676 m)   Wt 143 lb 8 oz (65.1 kg)   BMI 23.16 kg/m   Physical Exam:  HEENT:  [place]/AT, PERRLA, EOM Full, no nystagmus.  Funduscopic examination within normal limits.  No conjunctival erythema.  Tympanic membranes are pearly gray with normal landmarks.  External ear canals are normal.  Nasal passages are clear.  Oropharynx is clear.  He has dental fillings on the left side,  1 of his top molars and 1 of his bottom molars.  No significant lymphadenopathy.  No thyromegaly or nodules.  2+ carotid pulses without bruits. CV: Regular rate and rhythm without murmurs, rubs, or gallops.  No peripheral edema.  2+ radial and posterior tibial pulses. Lungs: Clear to auscultation throughout with no wheezing or areas of consolidation. Abd: Bowel sounds are active, no hepatosplenomegaly or masses.  Soft and nontender.  No audible bruits.  No evidence of ascites. Skin: He has what appears to be a fungal rash near his waistband on his abdomen.  He has multiple benign-appearing nevi on his torso.  No nail deformities.  No peripheral edema.   Imaging: None today.  Assessment & Plan: 1.  Chronic fatigue with palpitations, elevated TSH. -We will order a [facility] stool  test to assess gut health.  If everything is normal then we might consider a trial of thyroid hormone.  Otherwise we will optimize gut function to see if his other symptoms will improve accordingly.  Spent 45 minutes discussing symptoms and plan, and reviewing 14 page intake form and previous labs.   Procedures: No procedures performed  No notes on file     PMFS History: Patient Active Problem List   Diagnosis Date Noted  . Anxiety 04/18/2018  . PSVT (paroxysmal supraventricular tachycardia) ([facility]) 04/18/2018  . Chronic migraine without aura without status migrainosus, not intractable 12/02/2017  . Cervical lymphadenopathy 02/13/2014  . Disorder of male genital organs 02/13/2014  . Fatigue 02/13/2014  . 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 Otalgia, right 02/13/2014  . Reduced libido 02/13/2014   History reviewed. No pertinent past medical history.  History reviewed. No pertinent family history.  Past Surgical History:  Procedure Laterality Date  . WISDOM TOOTH EXTRACTION     Social History   Occupational History  . Not on file  Tobacco Use  . Smoking status: Never Smoker  . Smokeless tobacco: Never Used  Substance and Sexual Activity  . Alcohol use: No  . Drug use: No  . Sexual activity: Not on file

## 2018-07-15 ENCOUNTER — Telehealth (INDEPENDENT_AMBULATORY_CARE_PROVIDER_SITE_OTHER): Payer: Self-pay | Admitting: Family Medicine

## 2018-07-15 NOTE — Telephone Encounter (Signed)
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  I would suggest that you try some or all of the following:  Increase intake of resistant starch: https://draxe.com/nutrition/resistant-starch/  Start Saccharomyces: Example: https://www.pureencapsulations.com/saccharomyces-boulardii.html  The test raises concern for SIBO, so it would be worth trying soil-based probiotics: CardColumn.cz  Calcium D Glucarate, 1,000 mg daily: Example https://www.pureencapsulations.com/calcium-d-glucarate-capsules.html  No yeast was detected on their culture.  I think this gives [place] something to work with, and could be the underlying cause of your symptoms. Let's try to heal the gut for a few months before doing anything with the thyroid.

## 2018-07-18 ENCOUNTER — Other Ambulatory Visit (INDEPENDENT_AMBULATORY_CARE_PROVIDER_SITE_OTHER): Payer: Self-pay | Admitting: Family Medicine

## 2018-07-18 ENCOUNTER — Telehealth (INDEPENDENT_AMBULATORY_CARE_PROVIDER_SITE_OTHER): Payer: Self-pay

## 2018-07-18 DIAGNOSIS — R5383 Other fatigue: Secondary | ICD-10-CM

## 2018-07-18 NOTE — Telephone Encounter (Signed)
I called patient, no answer.

## 2018-07-18 NOTE — Telephone Encounter (Signed)
Hey can you call patient and schedule him a nurse visit to have labs drawn per Dr. Prince Rome when he is available?  Thank You.

## 2018-07-18 NOTE — Telephone Encounter (Signed)
-----   Message from Lavada Mesi, MD sent at 07/18/2018  1:59 PM EDT ----- Regarding: Lab draw Order in chart.

## 2018-07-19 NOTE — Telephone Encounter (Signed)
I spoke with patient. Christopher Perkins looked up test on [facility].  SST tube required. Patient to be here at 0830 for lab draw.  Order in chart from Dr. Prince Rome.

## 2018-07-19 NOTE — Telephone Encounter (Signed)
Fyi. Wanted you to be aware patient is coming in the morning and on nurse schedule. He will be here at 0830. April is scheduled as triage and is aware.

## 2018-07-20 ENCOUNTER — Other Ambulatory Visit (INDEPENDENT_AMBULATORY_CARE_PROVIDER_SITE_OTHER): Payer: Self-pay

## 2018-07-20 ENCOUNTER — Encounter (INDEPENDENT_AMBULATORY_CARE_PROVIDER_SITE_OTHER): Payer: Self-pay

## 2018-07-20 ENCOUNTER — Other Ambulatory Visit: Payer: Self-pay

## 2018-07-20 ENCOUNTER — Ambulatory Visit (INDEPENDENT_AMBULATORY_CARE_PROVIDER_SITE_OTHER): Payer: BLUE CROSS/BLUE SHIELD | Admitting: Family Medicine

## 2018-07-20 DIAGNOSIS — R5383 Other fatigue: Secondary | ICD-10-CM

## 2018-07-20 NOTE — Addendum Note (Signed)
Addended byPrescott Parma on: 07/20/2018 09:18 AM   Modules accepted: Orders

## 2018-07-20 NOTE — Telephone Encounter (Signed)
Patient came in for blood draw

## 2018-07-20 NOTE — Progress Notes (Signed)
Patient was here to have labs drawn only per Dr. Lavada Mesi.  Labs drawn on right arm for Gamma GT.

## 2018-07-21 ENCOUNTER — Telehealth (INDEPENDENT_AMBULATORY_CARE_PROVIDER_SITE_OTHER): Payer: Self-pay | Admitting: Family Medicine

## 2018-07-21 LAB — GAMMA GT: GGT: 13 U/L (ref 3–90)

## 2018-07-21 NOTE — Telephone Encounter (Signed)
GGT looks perfect.

## 2018-10-11 ENCOUNTER — Other Ambulatory Visit: Payer: Self-pay

## 2018-10-11 ENCOUNTER — Ambulatory Visit (INDEPENDENT_AMBULATORY_CARE_PROVIDER_SITE_OTHER): Payer: BLUE CROSS/BLUE SHIELD | Admitting: Family Medicine

## 2018-10-11 ENCOUNTER — Encounter: Payer: Self-pay | Admitting: Family Medicine

## 2018-10-11 DIAGNOSIS — M79669 Pain in unspecified lower leg: Secondary | ICD-10-CM | POA: Diagnosis not present

## 2018-10-11 DIAGNOSIS — R5383 Other fatigue: Secondary | ICD-10-CM | POA: Diagnosis not present

## 2018-10-11 DIAGNOSIS — R7989 Other specified abnormal findings of blood chemistry: Secondary | ICD-10-CM

## 2018-10-11 DIAGNOSIS — R21 Rash and other nonspecific skin eruption: Secondary | ICD-10-CM | POA: Diagnosis not present

## 2018-10-11 MED ORDER — CLOTRIMAZOLE-BETAMETHASONE 1-0.05 % EX CREA: 1.0000 "application " | TOPICAL_CREAM | Freq: Two times a day (BID) | CUTANEOUS | 3 refills | Status: DC

## 2018-10-11 NOTE — Progress Notes (Addendum)
Office Visit Note   Patient: Christopher Perkins           Date of Birth: May 27, 1985           MRN: [id number] Visit Date: 10/11/2018 Requested by: Tamala Julian, MD [address] [place],  [place] [address] PCP: Tamala Julian, MD  Subjective: Chief Complaint  Patient presents with  . Left Lower Leg - Pain  . rash around waistband    HPI: He is here with left greater than right calf vein concerns.  He states that over the past couple months he has had very prominent veins over his calves whenever he stands for any length of time.  Sometimes he feels a tightness in his legs but he does not get any peripheral edema.  He stays well-hydrated and eats extremely healthfully, has been using a little bit of sea salt to be sure he gets enough sodium.  He states that it happened last summer and went away in the colder months.  He still has a rash in the lower abdomen/suprapubic area.  It does not itch and it has gotten better, but his wife was concerned about it.  From a fatigue standpoint he feels like his saccharomyces probiotic and calcium glucorate and regular probiotics have made a big difference for him.  He also treated his iron deficiency successfully, he donated blood a month or 2 ago and his hemoglobin and hematocrit were normal.  He is very pleased with his results.                ROS: No fevers or chills.  All other systems were reviewed and are negative.  Objective: Vital Signs: There were no vitals taken for this visit.  Physical Exam:  General:  Alert and oriented, in no acute distress. Pulm:  Breathing unlabored. Psy:  Normal mood, congruent affect. Skin: He has slightly raised erythematous rash lower abdomen with some satellite lesions. Legs: No peripheral edema.  He is very muscular and has 2+ distal pulses.  No tenderness to palpation of the calf veins and no popliteal cords.  Imaging: None today  Assessment & Plan: 1.  Bilateral prominent calf veins -We  will think there is anything worrisome going on.  He is in very good physical condition with low body fat, which probably makes this seem more prominent. -Could contemplate wearing compression socks if he starts developing edema.  2.  Abdominal rash, suspect fungal - Trial of Lotrisone cream.  Could consider oral antifungals if symptoms worsen but he would prefer not to do anything that might mess up his GI status.  3.  Chronic fatigue, seems to have resolved -If it recurs we will recheck iron studies as well as thyroid function.     Procedures: No procedures performed  No notes on file     PMFS History: Patient Active Problem List   Diagnosis Date Noted  . Anxiety 04/18/2018  . PSVT (paroxysmal supraventricular tachycardia) ([facility]) 04/18/2018  . Chronic migraine without aura without status migrainosus, not intractable 12/02/2017  . Cervical lymphadenopathy 02/13/2014  . Disorder of male genital organs 02/13/2014  . Fatigue 02/13/2014  . 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 Otalgia, right 02/13/2014  . Reduced libido 02/13/2014   History reviewed. No pertinent past medical history.  History reviewed. No pertinent family history.  Past Surgical History:  Procedure Laterality Date  . WISDOM TOOTH EXTRACTION     Social History   Occupational History  . Not on  file  Tobacco Use  . Smoking status: Never Smoker  . Smokeless tobacco: Never Used  Substance and Sexual Activity  . Alcohol use: No  . Drug use: No  . Sexual activity: Not on file

## 2018-12-16 ENCOUNTER — Other Ambulatory Visit: Payer: Self-pay | Admitting: Family Medicine

## 2018-12-16 ENCOUNTER — Encounter: Payer: Self-pay | Admitting: Family Medicine

## 2018-12-16 DIAGNOSIS — R5383 Other fatigue: Secondary | ICD-10-CM

## 2018-12-16 DIAGNOSIS — D509 Iron deficiency anemia, unspecified: Secondary | ICD-10-CM

## 2018-12-16 DIAGNOSIS — R7989 Other specified abnormal findings of blood chemistry: Secondary | ICD-10-CM

## 2018-12-16 NOTE — Addendum Note (Signed)
Addended by: Hortencia Pilar on: 12/16/2018 02:45 PM   Modules accepted: Orders

## 2018-12-20 ENCOUNTER — Other Ambulatory Visit: Payer: Self-pay

## 2018-12-20 DIAGNOSIS — R7989 Other specified abnormal findings of blood chemistry: Secondary | ICD-10-CM

## 2018-12-20 DIAGNOSIS — D509 Iron deficiency anemia, unspecified: Secondary | ICD-10-CM

## 2018-12-20 DIAGNOSIS — R5383 Other fatigue: Secondary | ICD-10-CM

## 2018-12-22 ENCOUNTER — Telehealth: Payer: Self-pay | Admitting: Family Medicine

## 2018-12-22 NOTE — Telephone Encounter (Signed)
Labs look great so far.

## 2018-12-25 LAB — COMPREHENSIVE METABOLIC PANEL
AG Ratio: 1.9 (calc) (ref 1.0–2.5)
ALT: 12 U/L (ref 9–46)
AST: 14 U/L (ref 10–40)
Albumin: 4.6 g/dL (ref 3.6–5.1)
Alkaline phosphatase (APISO): 51 U/L (ref 36–130)
BUN: 18 mg/dL (ref 7–25)
CO2: 33 mmol/L — ABNORMAL HIGH (ref 20–32)
Calcium: 9.6 mg/dL (ref 8.6–10.3)
Chloride: 103 mmol/L (ref 98–110)
Creat: 1.14 mg/dL (ref 0.60–1.35)
Globulin: 2.4 g/dL (calc) (ref 1.9–3.7)
Glucose, Bld: 89 mg/dL (ref 65–139)
Potassium: 4.8 mmol/L (ref 3.5–5.3)
Sodium: 142 mmol/L (ref 135–146)
Total Bilirubin: 0.4 mg/dL (ref 0.2–1.2)
Total Protein: 7 g/dL (ref 6.1–8.1)

## 2018-12-25 LAB — CBC WITH DIFFERENTIAL/PLATELET
Absolute Monocytes: 392 cells/uL (ref 200–950)
Basophils Absolute: 41 cells/uL (ref 0–200)
Basophils Relative: 0.9 %
Eosinophils Absolute: 59 cells/uL (ref 15–500)
Eosinophils Relative: 1.3 %
HCT: 48.5 % (ref 38.5–50.0)
Hemoglobin: 16.1 g/dL (ref 13.2–17.1)
Lymphs Abs: 1746 cells/uL (ref 850–3900)
MCH: 29.9 pg (ref 27.0–33.0)
MCHC: 33.2 g/dL (ref 32.0–36.0)
MCV: 90 fL (ref 80.0–100.0)
MPV: 10.3 fL (ref 7.5–12.5)
Monocytes Relative: 8.7 %
Neutro Abs: 2264 cells/uL (ref 1500–7800)
Neutrophils Relative %: 50.3 %
Platelets: 267 10*3/uL (ref 140–400)
RBC: 5.39 10*6/uL (ref 4.20–5.80)
RDW: 12 % (ref 11.0–15.0)
Total Lymphocyte: 38.8 %
WBC: 4.5 10*3/uL (ref 3.8–10.8)

## 2018-12-25 LAB — IRON,TIBC AND FERRITIN PANEL
%SAT: 24 % (ref 20–48)
Ferritin: 71 ng/mL (ref 38–380)
Iron: 68 ug/dL (ref 50–180)
TIBC: 283 ug/dL (ref 250–425)

## 2018-12-25 LAB — THYROID PANEL WITH TSH
Free Thyroxine Index: 2.5 (ref 1.4–3.8)
T3 Uptake: 28 % (ref 22–35)
T4, Total: 8.8 ug/dL (ref 4.9–10.5)
TSH: 2.51 mIU/L (ref 0.40–4.50)

## 2018-12-25 LAB — C-REACTIVE PROTEIN: CRP: 0.6 mg/L

## 2018-12-25 LAB — SEDIMENTATION RATE: Sed Rate: 2 mm/h (ref 0–15)

## 2018-12-25 LAB — MAGNESIUM, RBC: Magnesium RBC: 5.5 mg/dL (ref 4.0–6.4)

## 2019-01-11 ENCOUNTER — Encounter: Payer: Self-pay | Admitting: Family Medicine

## 2019-01-11 DIAGNOSIS — M79669 Pain in unspecified lower leg: Secondary | ICD-10-CM

## 2019-04-18 ENCOUNTER — Encounter: Payer: Self-pay | Admitting: Family Medicine

## 2019-04-19 ENCOUNTER — Ambulatory Visit (INDEPENDENT_AMBULATORY_CARE_PROVIDER_SITE_OTHER): Payer: Managed Care, Other (non HMO) | Admitting: Family Medicine

## 2019-04-19 ENCOUNTER — Encounter: Payer: Self-pay | Admitting: Family Medicine

## 2019-04-19 ENCOUNTER — Other Ambulatory Visit: Payer: Self-pay

## 2019-04-19 DIAGNOSIS — R6884 Jaw pain: Secondary | ICD-10-CM

## 2019-04-19 MED ORDER — CEPHALEXIN 500 MG PO CAPS: 500.0000 mg | ORAL_CAPSULE | Freq: Four times a day (QID) | ORAL | 0 refills | Status: DC

## 2019-04-19 NOTE — Progress Notes (Signed)
   Office Visit Note   Patient: Christopher Perkins           Date of Birth: 27-Apr-1985           MRN: [id number] Visit Date: 04/19/2019 Requested by: Juanita Laster, MD [address] [place],  [place] [address] PCP: Juanita Laster, MD  Subjective: Chief Complaint  Patient presents with  . tightness/tenderness/inflammation left jaw    HPI: He is here with left-sided jaw pain.  Symptoms started a few days ago, no injury.  He started noticing soreness when opening and closing his mouth.  No fever or chills, no significant ear pain.  No sinus congestion.  No sores inside his mouth.  Incidentally he asked me about an episode of trembling.  He woke up a few mornings ago feeling like his heart was racing and heavy trembling lasting for about 20 minutes.  He states that this happened before several months earlier.  He feels fine now.  He showed me a video that his wife took.              ROS:   All other systems were reviewed and are negative.  Objective: Vital Signs: There were no vitals taken for this visit.  Physical Exam:  General:  Alert and oriented, in no acute distress. Pulm:  Breathing unlabored. Psy:  Normal mood, congruent affect. Skin: No rash or erythema. HEENT: Tympanic membranes are pearly gray bilaterally.  There is no significant lymphadenopathy in his neck.  Slight fullness of the parotid gland compared to the right.  Slightly tender to touch right there.  Oropharynx is clear.  No tenderness at the TMJ.  Imaging: None today  Assessment & Plan: 1.  Left-sided jaw pain, possible sialoadenitis -He will suck on sour lemon drops.  Warm compresses.  Keflex if symptoms worsen.  2.  Trembling episode, etiology uncertain. -If this happens more frequently we might order salivary cortisol testing.    Procedures: No procedures performed  No notes on file     PMFS History: Patient Active Problem List   Diagnosis Date Noted  . Anxiety 04/18/2018  . PSVT  (paroxysmal supraventricular tachycardia) (HCC) 04/18/2018  . Chronic migraine without aura without status migrainosus, not intractable 12/02/2017  . Cervical lymphadenopathy 02/13/2014  . Disorder of male genital organs 02/13/2014  . Fatigue 02/13/2014  . 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 Otalgia, right 02/13/2014  . Reduced libido 02/13/2014   History reviewed. No pertinent past medical history.  History reviewed. No pertinent family history.  Past Surgical History:  Procedure Laterality Date  . WISDOM TOOTH EXTRACTION     Social History   Occupational History  . Not on file  Tobacco Use  . Smoking status: Never Smoker  . Smokeless tobacco: Never Used  Substance and Sexual Activity  . Alcohol use: No  . Drug use: No  . Sexual activity: Not on file

## 2019-04-21 ENCOUNTER — Other Ambulatory Visit: Payer: Managed Care, Other (non HMO)

## 2019-06-16 ENCOUNTER — Encounter: Payer: Self-pay | Admitting: Family Medicine

## 2019-09-25 ENCOUNTER — Encounter: Payer: Self-pay | Admitting: Family Medicine

## 2019-09-25 DIAGNOSIS — R55 Syncope and collapse: Secondary | ICD-10-CM

## 2019-09-25 DIAGNOSIS — M7989 Other specified soft tissue disorders: Secondary | ICD-10-CM

## 2019-09-26 NOTE — Addendum Note (Signed)
Addended by: Lillia Carmel on: 09/26/2019 07:54 AM   Modules accepted: Orders

## 2019-09-28 ENCOUNTER — Other Ambulatory Visit: Payer: Self-pay

## 2019-09-28 ENCOUNTER — Ambulatory Visit (INDEPENDENT_AMBULATORY_CARE_PROVIDER_SITE_OTHER): Payer: 59 | Admitting: Cardiovascular Disease

## 2019-09-28 ENCOUNTER — Encounter: Payer: Self-pay | Admitting: Cardiovascular Disease

## 2019-09-28 VITALS — BP 110/76 | HR 61 | Ht 67.0 in | Wt 143.0 lb

## 2019-09-28 DIAGNOSIS — I471 Supraventricular tachycardia, unspecified: Secondary | ICD-10-CM

## 2019-09-28 DIAGNOSIS — R55 Syncope and collapse: Secondary | ICD-10-CM

## 2019-09-28 DIAGNOSIS — R002 Palpitations: Secondary | ICD-10-CM | POA: Diagnosis not present

## 2019-09-28 NOTE — Assessment & Plan Note (Signed)
Christopher Perkins was referred to me by Dr. Prince Rome for symptomatic palpitations.  He has been diagnosed with PSVT in the past by another cardiologist.  He has no cardiac risk factors.  His mother did have palpitations.  He has had palpitations since he was a teenager that occurred randomly several times a year.  Recently they have become more frequent and severe.  He had presyncope earlier this week.  He was seen in the ER at [facility] December 2019 where the ER note suggested that he had A. fib although further evaluation by cardiologist refine the diagnosis to PSVT.  Does not drink caffeine or alcohol.  I am to get a 2D echo to evaluate LV size and function, thyroid function test that 30-day event monitor to further elucidate the mechanism of his palpitations.

## 2019-09-28 NOTE — Patient Instructions (Addendum)
Medication Instructions:  Your Physician recommend you continue on your current medication as directed.    *If you need a refill on your cardiac medications before your next appointment, please call your pharmacy*   Lab Work: Your physician recommends that you return for lab work today ( BMP, TSH, T4F, T3Free).  If you have labs (blood work) drawn today and your tests are completely normal, you will receive your results only by: Marland Kitchen MyChart Message (if you have MyChart) OR . A paper copy in the mail If you have any lab test that is abnormal or we need to change your treatment, we will call you to review the results.   Testing/Procedures: Your physician has requested that you have an echocardiogram. Echocardiography is a painless test that uses sound waves to create images of your heart. It provides your doctor with information about the size and shape of your heart and how well your heart's chambers and valves are working. This procedure takes approximately one hour. There are no restrictions for this procedure. [address]  Your physician has recommended that you wear a 30 day event monitor. Event monitors are medical devices that record the heart's electrical activity. Doctors most often [place] these monitors to diagnose arrhythmias. Arrhythmias are problems with the speed or rhythm of the heartbeat. The monitor is a small, portable device. You can wear one while you do your normal daily activities. This is usually used to diagnose what is causing palpitations/syncope (passing out). Someone from our office will call to mail monitor    Follow-Up: At [facility], you and your health needs are our priority.  As part of our continuing mission to provide you with exceptional heart care, we have created designated Provider Care Teams.  These Care Teams include your primary Cardiologist (physician) and Advanced Practice Providers (APPs -  Physician Assistants and Nurse  Practitioners) who all work together to provide you with the care you need, when you need it.  We recommend signing up for the patient portal called "MyChart".  Sign up information is provided on this After Visit Summary.  MyChart is used to connect with patients for Virtual Visits (Telemedicine).  Patients are able to view lab/test results, encounter notes, upcoming appointments, etc.  Non-urgent messages can be sent to your provider as well.   To learn more about what you can do with MyChart, go to [email or web address].    Your next appointment:   3 month(s)  The format for your next appointment:   In Person  Provider:   Nanetta Batty, MD

## 2019-09-28 NOTE — Progress Notes (Signed)
09/28/2019 Christopher Perkins   07/07/1985  [id number]  Primary Physician Hilts, Casimiro Needle, MD Primary Cardiologist: Runell Gess MD Christopher Perkins, [place], [place]  HPI:  Christopher Perkins is a 34 y.o. 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  He was referred by Dr. Prince Rome, his PCP, for evaluation of palpitations and presyncope.  He has no cardiac risk factors.  His mother did have palpitations but never had a formal diagnosis.  Is never had heart attack or stroke.  Denies chest pain or shortness of breath.  That does not drink caffeine or alcohol.  He said palpitations have been random since his adolescence occurring several times a year.  He did have an episode in December 2019 where he was evaluated at in the ER at [facility].  The ER notes that the he had A. fib however further evaluation by cardiology suggested this was PSVT.  He did wear a 2-week Zio patch at that time which did not show any arrhythmias.  These episodes have been getting more frequent recently and he was presyncopal earlier this week.   No outpatient medications have been marked as taking for the 09/28/19 encounter (Office Visit) with Runell Gess, MD.     No Known Allergies  Social History   Socioeconomic History  . Marital status: Married    Spouse name: Not on file  . Number of children: Not on file  . Years of education: Not on file  . Highest education level: Not on file  Occupational History  . Not on file  Tobacco Use  . Smoking status: Never Smoker  . Smokeless tobacco: Never Used  Substance and Sexual Activity  . Alcohol use: No  . Drug use: No  . Sexual activity: Not on file  Other Topics Concern  . Not on file  Social History Narrative  . Not on file   Social Determinants of Health   Financial Resource Strain:   . Difficulty of Paying Living Expenses:   Food Insecurity:   . Worried About Community education officer in the Last Year:   . Barista in the Last Year:   Transportation Needs:   . Freight forwarder (Medical):   Marland Kitchen Lack of Transportation (Non-Medical):   Physical Activity:   . Days of Exercise per Week:   . Minutes of Exercise per Session:   Stress:   . Feeling of Stress :   Social Connections:   . Frequency of Communication with Friends and Family:   . Frequency of Social Gatherings with Friends and Family:   . Attends Religious Services:   . Active Member of Clubs or Organizations:   . Attends Banker Meetings:   Marland Kitchen Marital Status:   Intimate Partner Violence:   . Fear of Current or Ex-Partner:   . Emotionally Abused:   Marland Kitchen Physically Abused:   . Sexually Abused:      Review of Systems: General: negative for chills, fever, night sweats or weight changes.  Cardiovascular: negative for chest pain, dyspnea on exertion, edema, orthopnea, palpitations, paroxysmal nocturnal dyspnea or shortness of breath Dermatological: negative for rash Respiratory: negative for cough or wheezing Urologic: negative for hematuria Abdominal: negative for nausea, vomiting, diarrhea, bright red blood per rectum, melena, or hematemesis Neurologic: negative for visual changes, syncope, or dizziness All other systems reviewed and are otherwise negative except as noted above.  Blood pressure 110/76, pulse 61, height 5\' 7"  (1.702 m), weight 143 lb (64.9 kg), SpO2 96 %.  General appearance: alert and no distress Neck: no adenopathy, no carotid bruit, no JVD, supple, symmetrical, trachea midline and thyroid not enlarged, symmetric, no tenderness/mass/nodules Lungs: clear to auscultation bilaterally Heart: regular rate and rhythm, S1, S2 normal, no murmur, click, rub or gallop Extremities: extremities normal, atraumatic, no cyanosis or edema Pulses: 2+ and symmetric Skin: Skin color, texture, turgor normal. No rashes or lesions Neurologic: Alert and oriented X 3, normal  strength and tone. Normal symmetric reflexes. Normal coordination and gait  EKG sinus rhythm at 61 without ST or T wave changes.  I personally reviewed this EKG.  ASSESSMENT AND PLAN:   PSVT (paroxysmal supraventricular tachycardia) (HCC) Tim was referred to me by Dr. Junius Roads for symptomatic palpitations.  He has been diagnosed with PSVT in the past by another cardiologist.  He has no cardiac risk factors.  His mother did have palpitations.  He has had palpitations since he was a teenager that occurred randomly several times a year.  Recently they have become more frequent and severe.  He had presyncope earlier this week.  He was seen in the ER at [facility] December 2019 where the ER note suggested that he had A. fib although further evaluation by cardiologist refine the diagnosis to PSVT.  Does not drink caffeine or alcohol.  I am to get a 2D echo to evaluate LV size and function, thyroid function test that 30-day event monitor to further elucidate the mechanism of his palpitations.      Lorretta Harp MD FACP,FACC,FAHA, [facility] 09/28/2019 3:58 PM

## 2019-09-29 ENCOUNTER — Telehealth: Payer: Self-pay

## 2019-09-29 LAB — BASIC METABOLIC PANEL
BUN/Creatinine Ratio: 16 (ref 9–20)
BUN: 22 mg/dL — ABNORMAL HIGH (ref 6–20)
CO2: 26 mmol/L (ref 20–29)
Calcium: 9.5 mg/dL (ref 8.7–10.2)
Chloride: 100 mmol/L (ref 96–106)
Creatinine, Ser: 1.39 mg/dL — ABNORMAL HIGH (ref 0.76–1.27)
GFR calc Af Amer: 75 mL/min/{1.73_m2} (ref >59)
GFR calc non Af Amer: 65 mL/min/{1.73_m2} (ref >59)
Glucose: 86 mg/dL (ref 65–99)
Potassium: 4.2 mmol/L (ref 3.5–5.2)
Sodium: 142 mmol/L (ref 134–144)

## 2019-09-29 LAB — TSH+T4F+T3FREE
Free T4: 1.4 ng/dL (ref 0.82–1.77)
T3, Free: 2.8 pg/mL (ref 2.0–4.4)
TSH: 1.92 u[IU]/mL (ref 0.450–4.500)

## 2019-09-29 NOTE — Telephone Encounter (Signed)
Went over brief monitor instructions with pt. Verified address. 30 day Event Monitor registered to be mailed to pt.

## 2019-10-18 ENCOUNTER — Ambulatory Visit (HOSPITAL_COMMUNITY): Payer: 59 | Attending: Cardiology

## 2019-10-18 ENCOUNTER — Other Ambulatory Visit: Payer: Self-pay

## 2019-10-18 DIAGNOSIS — R002 Palpitations: Secondary | ICD-10-CM | POA: Diagnosis not present

## 2019-10-18 DIAGNOSIS — R55 Syncope and collapse: Secondary | ICD-10-CM | POA: Insufficient documentation

## 2019-10-29 ENCOUNTER — Ambulatory Visit (INDEPENDENT_AMBULATORY_CARE_PROVIDER_SITE_OTHER): Payer: 59

## 2019-10-29 DIAGNOSIS — R002 Palpitations: Secondary | ICD-10-CM | POA: Diagnosis not present

## 2019-10-29 DIAGNOSIS — R55 Syncope and collapse: Secondary | ICD-10-CM | POA: Diagnosis not present

## 2019-11-30 ENCOUNTER — Telehealth: Payer: Self-pay | Admitting: Cardiovascular Disease

## 2019-11-30 NOTE — Telephone Encounter (Signed)
Patient is returning Jenna's call. Per Eileen Stanford, advised the patient she will call him back in 5-10 mins.

## 2019-12-18 ENCOUNTER — Encounter: Payer: Self-pay | Admitting: Family Medicine

## 2020-01-02 ENCOUNTER — Ambulatory Visit: Payer: Managed Care, Other (non HMO) | Admitting: Cardiovascular Disease

## 2020-01-16 ENCOUNTER — Other Ambulatory Visit: Payer: Self-pay

## 2020-01-16 DIAGNOSIS — R0789 Other chest pain: Secondary | ICD-10-CM

## 2020-01-30 ENCOUNTER — Other Ambulatory Visit: Payer: Self-pay

## 2020-01-30 ENCOUNTER — Ambulatory Visit (INDEPENDENT_AMBULATORY_CARE_PROVIDER_SITE_OTHER)
Admission: RE | Admit: 2020-01-30 | Discharge: 2020-01-30 | Disposition: A | Discharge status: Home / Self Care | Payer: Self-pay | Source: Ambulatory Visit | Attending: Cardiovascular Disease | Admitting: Cardiovascular Disease

## 2020-01-30 DIAGNOSIS — R0789 Other chest pain: Secondary | ICD-10-CM

## 2020-01-31 ENCOUNTER — Encounter: Payer: Self-pay | Admitting: Cardiovascular Disease

## 2020-01-31 ENCOUNTER — Ambulatory Visit (INDEPENDENT_AMBULATORY_CARE_PROVIDER_SITE_OTHER): Payer: 59 | Admitting: Cardiovascular Disease

## 2020-01-31 VITALS — BP 108/59 | HR 69 | Ht 66.0 in | Wt 144.6 lb

## 2020-01-31 DIAGNOSIS — R002 Palpitations: Secondary | ICD-10-CM

## 2020-01-31 DIAGNOSIS — I471 Supraventricular tachycardia: Secondary | ICD-10-CM

## 2020-01-31 NOTE — Progress Notes (Signed)
Christopher Perkins returns today for follow-up.  He had a 2-week Zio patch that resulted on 10/29/2019 that showed no arrhythmias.  2D echo was normal.  Coronary calcium score was 0.  Since I saw him 4 months ago he had 2 episodes of spontaneous tachypalpitations.  He did not have syncope.  I am referring him to one of our electrophysiologist for consideration of loop recorder implantation.  I will see him back in 6 months.   Runell Gess, M.D., FACP, [facility], [facility] [address] [place], [place]  [address]  [phone] 01/31/2020 5:00 PM

## 2020-01-31 NOTE — Patient Instructions (Addendum)
Medication Instructions:  No changes *If you need a refill on your cardiac medications before your next appointment, please call your pharmacy*   Lab Work: None ordered If you have labs (blood work) drawn today and your tests are completely normal, you will receive your results only by: Marland Kitchen MyChart Message (if you have MyChart) OR . A paper copy in the mail If you have any lab test that is abnormal or we need to change your treatment, we will call you to review the results.   Testing/Procedures: None ordered   Follow-Up: At [facility], you and your health needs are our priority.  As part of our continuing mission to provide you with exceptional heart care, we have created designated Provider Care Teams.  These Care Teams include your primary Cardiologist (physician) and Advanced Practice Providers (APPs -  Physician Assistants and Nurse Practitioners) who all work together to provide you with the care you need, when you need it.  We recommend signing up for the patient portal called "MyChart".  Sign up information is provided on this After Visit Summary.  MyChart is used to connect with patients for Virtual Visits (Telemedicine).  Patients are able to view lab/test results, encounter notes, upcoming appointments, etc.  Non-urgent messages can be sent to your provider as well.   To learn more about what you can do with MyChart, go to [email or web address].    Your next appointment:   6 month(s)  The format for your next appointment:   In Person  Provider:   You may see Dr. Allyson Sabal or one of the following Advanced Practice Providers on your designated Care Team:    Corine Shelter, PA-C  Mark, [place]  Edd Fabian, [place]    Other Instructions A referral has been placed for an Electrophysiologist. They will call you to set this appointment up.

## 2020-02-07 ENCOUNTER — Encounter: Payer: Self-pay | Admitting: Cardiology

## 2020-07-01 ENCOUNTER — Encounter (INDEPENDENT_AMBULATORY_CARE_PROVIDER_SITE_OTHER): Payer: Self-pay | Admitting: Family Medicine

## 2021-06-09 ENCOUNTER — Ambulatory Visit (HOSPITAL_COMMUNITY)
Admission: EM | Admit: 2021-06-09 | Discharge: 2021-06-09 | Disposition: A | Discharge status: Other Acute Inpatient Hospital | Payer: 59

## 2021-06-09 ENCOUNTER — Other Ambulatory Visit: Payer: Self-pay

## 2021-06-09 ENCOUNTER — Emergency Department (HOSPITAL_COMMUNITY)
Admission: EM | Admit: 2021-06-09 | Discharge: 2021-06-09 | Disposition: A | Discharge status: Home / Self Care | Payer: 59 | Attending: Emergency Medicine | Admitting: Emergency Medicine

## 2021-06-09 ENCOUNTER — Emergency Department (HOSPITAL_COMMUNITY): Payer: 59

## 2021-06-09 ENCOUNTER — Encounter (HOSPITAL_COMMUNITY): Payer: Self-pay | Admitting: Emergency Medicine

## 2021-06-09 DIAGNOSIS — S60551A Superficial foreign body of right hand, initial encounter: Secondary | ICD-10-CM | POA: Diagnosis not present

## 2021-06-09 DIAGNOSIS — S6991XA Unspecified injury of right wrist, hand and finger(s), initial encounter: Secondary | ICD-10-CM | POA: Diagnosis present

## 2021-06-09 DIAGNOSIS — Z23 Encounter for immunization: Secondary | ICD-10-CM | POA: Diagnosis not present

## 2021-06-09 DIAGNOSIS — W450XXA Nail entering through skin, initial encounter: Secondary | ICD-10-CM | POA: Insufficient documentation

## 2021-06-09 MED ORDER — TETANUS-DIPHTH-ACELL PERTUSSIS 5-2.5-18.5 LF-MCG/0.5 IM SUSY
0.5000 mL | PREFILLED_SYRINGE | Freq: Once | INTRAMUSCULAR | Status: AC
  Administered 2021-06-09: 0.5 mL via INTRAMUSCULAR
  Filled 2021-06-09: qty 0.5

## 2021-06-09 MED ORDER — SULFAMETHOXAZOLE-TRIMETHOPRIM 800-160 MG PO TABS: 1.0000 | ORAL_TABLET | Freq: Two times a day (BID) | ORAL | 0 refills | Status: DC

## 2021-06-09 MED ORDER — BUPIVACAINE HCL (PF) 0.25 % IJ SOLN
10.0000 mL | Freq: Once | INTRAMUSCULAR | Status: AC
  Administered 2021-06-09: 10 mL
  Filled 2021-06-09: qty 30

## 2021-06-09 NOTE — ED Provider Notes (Signed)
[facility] EMERGENCY DEPARTMENT Provider Note   CSN: [id number] Arrival date & time: 06/09/21  1657     History  Chief Complaint  Patient presents with   Hand Injury    Christopher Perkins is a 36 y.o. male.  Pt was using a nail gun and accidentally shot a nail into his right hand   The history is provided by the patient. No language interpreter was used.  Hand Injury Location:  Hand Hand location:  R hand Pain details:    Quality:  Aching   Radiates to:  Does not radiate   Severity:  Moderate   Onset quality:  Gradual   Timing:  Constant   Progression:  Worsening Handedness:  Right-handed Dislocation: no   Foreign body present:  No foreign bodies Tetanus status:  Unknown Relieved by:  Nothing Worsened by:  Nothing Ineffective treatments:  None tried     Home Medications Prior to Admission medications   Not on File      Allergies    Patient has no known allergies.    Review of Systems   Review of Systems  Skin:  Positive for wound.  All other systems reviewed and are negative.  Physical Exam Updated Vital Signs BP (!) 141/91    Pulse 81    Temp 98.6 F (37 C) (Oral)    Resp 16    SpO2 98%  Physical Exam Vitals reviewed.  Constitutional:      Appearance: Normal appearance.  Cardiovascular:     Rate and Rhythm: Normal rate.  Pulmonary:     Effort: Pulmonary effort is normal.  Musculoskeletal:        General: Tenderness present.     Comments: Nail in lateral aspect of right hand,  normal sensation,  good range of motion  nv  intact   Skin:    General: Skin is warm.     Capillary Refill: Capillary refill takes less than 2 seconds.  Neurological:     General: No focal deficit present.     Mental Status: He is alert.  Psychiatric:        Mood and Affect: Mood normal.    ED Results / Procedures / Treatments   Labs (all labs ordered are listed, but only abnormal results are displayed) Labs Reviewed - No data to  display  EKG None  Radiology DG Hand Complete Right  Result Date: 06/09/2021 CLINICAL DATA:  Nail injury. EXAM: RIGHT HAND - COMPLETE 3+ VIEW COMPARISON:  None. FINDINGS: There is no evidence of fracture or dislocation. There is no evidence of arthropathy or other focal bone abnormality. There is a large nail that passes through the soft tissues adjacent to the first metacarpal, but does not penetrate bone. IMPRESSION: Large nail seen in the soft tissues adjacent to first metacarpal. No bony abnormality is noted. Electronically Signed   By: Marijo Conception M.D.   On: 06/09/2021 17:40    Procedures .Foreign Body Removal  Date/Time: 06/09/2021 7:12 PM Performed by: Fransico Meadow, PA-C Authorized by: Fransico Meadow, PA-C  Consent: Verbal consent obtained. Risks and benefits: risks, benefits and alternatives were discussed Consent given by: patient Patient understanding: patient states understanding of the procedure being performed Required items: required blood products, implants, devices, and special equipment available Time out: Immediately prior to procedure a "time out" was called to verify the correct patient, procedure, equipment, support staff and site/side marked as required. Anesthesia: local infiltration  Anesthesia: Local Anesthetic: bupivacaine 0.5%  without epinephrine Anesthetic total: 3 mL 1 objects recovered. Objects recovered: nail Post-procedure assessment: foreign body removed Patient tolerance: patient tolerated the procedure well with no immediate complications     Medications Ordered in ED Medications  Tdap (BOOSTRIX) injection 0.5 mL (0.5 mLs Intramuscular Given 06/09/21 1854)  bupivacaine (PF) (MARCAINE) 0.25 % injection 10 mL (10 mLs Infiltration Given 06/09/21 1854)    ED Course/ Medical Decision Making/ A&P                           Medical Decision Making Nail in right hand  Problems Addressed: Foreign body of right hand, initial encounter: acute  illness or injury  Amount and/or Complexity of Data Reviewed Radiology: ordered and independent interpretation performed. Decision-making details documented in ED Course.    Details: nail in right ahnd  Risk Prescription drug management.           Final Clinical Impression(s) / ED Diagnoses Final diagnoses:  Foreign body of right hand, initial encounter    Rx / DC Orders ED Discharge Orders          Ordered    sulfamethoxazole-trimethoprim (BACTRIM DS) 800-160 MG tablet  2 times daily        06/09/21 1915          An After Visit Summary was printed and given to the patient.     Sidney Ace 06/09/21 1916    Carmin Muskrat, MD 06/09/21 [phone]

## 2021-06-09 NOTE — ED Notes (Signed)
Pt in waiting room stating he is losing feeling in his hand.

## 2021-06-09 NOTE — ED Triage Notes (Signed)
Pt sent from [facility] d/t large nail puncture in R hand. PMS intact. Nail still in hand. Unknown last tetanus shot.

## 2021-06-09 NOTE — ED Notes (Signed)
Unable to manipulate chart.  Indicates patient is a patient in ED

## 2021-06-09 NOTE — ED Triage Notes (Signed)
Patient has a nail in right hand, below thumb.  Visible entrance and exit wound.  Unknown last tetanus.  Patient is able to move thumb and reports no numbness

## 2021-06-09 NOTE — ED Notes (Signed)
Dr Eustace Moore evaluated patient and we will see at [facility]

## 2021-10-22 ENCOUNTER — Encounter: Payer: Self-pay | Admitting: Family Medicine

## 2021-10-22 ENCOUNTER — Ambulatory Visit: Payer: 59 | Admitting: Family Medicine

## 2021-10-22 VITALS — BP 108/70 | HR 65 | Temp 98.2°F | Ht 66.0 in | Wt 144.6 lb

## 2021-10-22 DIAGNOSIS — I471 Supraventricular tachycardia: Secondary | ICD-10-CM | POA: Diagnosis not present

## 2021-10-22 DIAGNOSIS — Z0000 Encounter for general adult medical examination without abnormal findings: Secondary | ICD-10-CM

## 2021-10-22 DIAGNOSIS — G43009 Migraine without aura, not intractable, without status migrainosus: Secondary | ICD-10-CM | POA: Diagnosis not present

## 2021-10-22 LAB — LIPID PANEL
Cholesterol: 214 mg/dL — ABNORMAL HIGH (ref 0–200)
HDL: 76.1 mg/dL (ref >39.00)
LDL Cholesterol: 124 mg/dL — ABNORMAL HIGH (ref 0–99)
NonHDL: 138.39
Total CHOL/HDL Ratio: 3
Triglycerides: 73 mg/dL (ref 0.0–149.0)
VLDL: 14.6 mg/dL (ref 0.0–40.0)

## 2021-10-22 LAB — COMPREHENSIVE METABOLIC PANEL
ALT: 13 U/L (ref 0–53)
AST: 15 U/L (ref 0–37)
Albumin: 4.6 g/dL (ref 3.5–5.2)
Alkaline Phosphatase: 57 U/L (ref 39–117)
BUN: 27 mg/dL — ABNORMAL HIGH (ref 6–23)
CO2: 32 mEq/L (ref 19–32)
Calcium: 9.8 mg/dL (ref 8.4–10.5)
Chloride: 102 mEq/L (ref 96–112)
Creatinine, Ser: 1.15 mg/dL (ref 0.40–1.50)
GFR: 81.1 mL/min (ref >60.00)
Glucose, Bld: 76 mg/dL (ref 70–99)
Potassium: 5.1 mEq/L (ref 3.5–5.1)
Sodium: 141 mEq/L (ref 135–145)
Total Bilirubin: 0.7 mg/dL (ref 0.2–1.2)
Total Protein: 7.3 g/dL (ref 6.0–8.3)

## 2021-10-22 LAB — CBC WITH DIFFERENTIAL/PLATELET
Basophils Absolute: 0.1 10*3/uL (ref 0.0–0.1)
Basophils Relative: 1.7 % (ref 0.0–3.0)
Eosinophils Absolute: 0.1 10*3/uL (ref 0.0–0.7)
Eosinophils Relative: 1.7 % (ref 0.0–5.0)
HCT: 46 % (ref 39.0–52.0)
Hemoglobin: 15.4 g/dL (ref 13.0–17.0)
Lymphocytes Relative: 34.3 % (ref 12.0–46.0)
Lymphs Abs: 1.4 10*3/uL (ref 0.7–4.0)
MCHC: 33.5 g/dL (ref 30.0–36.0)
MCV: 90.8 fl (ref 78.0–100.0)
Monocytes Absolute: 0.4 10*3/uL (ref 0.1–1.0)
Monocytes Relative: 10.1 % (ref 3.0–12.0)
Neutro Abs: 2.1 10*3/uL (ref 1.4–7.7)
Neutrophils Relative %: 52.2 % (ref 43.0–77.0)
Platelets: 252 10*3/uL (ref 150.0–400.0)
RBC: 5.07 Mil/uL (ref 4.22–5.81)
RDW: 12.8 % (ref 11.5–15.5)
WBC: 4.1 10*3/uL (ref 4.0–10.5)

## 2021-10-22 LAB — TSH: TSH: 2.56 u[IU]/mL (ref 0.35–5.50)

## 2021-10-22 NOTE — Progress Notes (Signed)
Subjective  Chief Complaint  Patient presents with   Establish Care   HPI: Christopher Perkins is a 36 y.o. male who presents to [place] at [facility] today for a Male Wellness Visit.   Wellness Visit: annual visit with health maintenance review and exam   36 year old healthy male presents to establish care and for complete physical.  Married, no children.  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  No new concerns.  I reviewed his chart in detail.  He has history of anxiety and palpitations.  He had a rare episode of PSVT but otherwise 7-day Holter and echocardiogram were normal.  He has seen Dr. Alvester Chou in the past.  He has not been to an electrophysiologist.  He reports he gets rare palpitations and associated with lightheadedness at times.  Not bothersome.  He has no other chronic medical problems Migraine headache: First few nights ago.  Describes left unilateral throbbing severe headache associated with photophobia and nausea without vomiting.  No triggers.  In the past has had some mild tension headaches.  Has never used migraine medicines.  Body mass index is 23.34 kg/m. Wt Readings from Last 3 Encounters:  10/22/21 144 lb 9.6 oz (65.6 kg)  01/31/20 144 lb 9.6 oz (65.6 kg)  09/28/19 143 lb (64.9 kg)    Patient Active Problem List   Diagnosis Date Noted   Anxiety 04/18/2018   PSVT (paroxysmal supraventricular tachycardia) ([place]) 04/18/2018   Migraine without aura and without status migrainosus, not intractable 12/02/2017   Health Maintenance  Topic Date Due   Hepatitis C Screening  Never done   COVID-19 Vaccine (1) 11/07/2021 (Originally 06/09/1986)   INFLUENZA VACCINE  11/11/2021   TETANUS/TDAP  06/10/2031   HPV VACCINES  Aged Out   HIV Screening  Discontinued   Immunization History  Administered Date(s) Administered   Tdap 02/17/2013, 06/09/2021   We updated and reviewed the patient's past history in detail and it is documented below. Allergies: Patient has No  Known Allergies. Past Medical History  has no past medical history on file. Past Surgical History  has a past surgical history that includes Wisdom tooth extraction and Hernia repair. Social History Patient  reports that he has never smoked. He has never used smokeless tobacco. He reports that he does not drink alcohol and does not use drugs. Family History Patient family history includes Healthy in his father, maternal grandmother, and sister; Heart disease in his maternal grandfather; Hyperlipidemia in his mother. Review of Systems: Constitutional: negative for fever or malaise Ophthalmic: negative for photophobia, double vision or loss of vision Cardiovascular: negative for chest pain, dyspnea on exertion, or new LE swelling Respiratory: negative for SOB or persistent cough Gastrointestinal: negative for abdominal pain, change in bowel habits or melena Genitourinary: negative for dysuria or gross hematuria Musculoskeletal: negative for new gait disturbance or muscular weakness Integumentary: negative for new or persistent rashes, no breast lumps Neurological: negative for TIA or stroke symptoms Psychiatric: negative for SI or delusions Allergic/Immunologic: negative for hives  Patient Care Team    Relationship Specialty Notifications Start End  Leamon Arnt, MD PCP - General Family Medicine  10/22/21    Objective  Vitals: BP 108/70   Pulse 65   Temp 98.2 F (36.8 C)   Ht 5' 6" (1.676 m)   Wt 144 lb 9.6 oz (65.6 kg)   SpO2 98%   BMI 23.34 kg/m  General:  Well developed, well nourished, no acute distress  Psych:  Alert and  orientedx3,normal mood and affect HEENT:  Normocephalic, atraumatic, non-icteric sclera, PERRL, oropharynx is clear without mass or exudate, supple neck without adenopathy, mass or thyromegaly Cardiovascular:  Normal S1, S2, RRR without gallop, rub or murmur, nondisplaced PMI, +2 distal pulses in bilateral upper and lower extremities. Respiratory:  Good  breath sounds bilaterally, CTAB with normal respiratory effort Gastrointestinal: normal bowel sounds, soft, non-tender, no noted masses. No HSM MSK: no deformities, contusions. Joints are without erythema or swelling. Spine and CVA region are nontender Skin:  Warm, no rashes or suspicious lesions noted, multiple moles Neurologic:    Mental status is normal. CN 2-11 are normal. Gross motor and sensory exams are normal. Stable gait. No tremor GU: No inguinal hernias or adenopathy are appreciated bilaterally  Assessment  1. Annual physical exam   2. PSVT (paroxysmal supraventricular tachycardia) ([place])   3. Migraine without aura and without status migrainosus, not intractable      Plan  Male Wellness Visit: Age appropriate Health Maintenance and Prevention measures were discussed with patient. Included topics are cancer screening recommendations, ways to keep healthy (see AVS) including dietary and exercise recommendations, regular eye and dental care, use of seat belts, and avoidance of moderate alcohol use and tobacco use.  Recommend annual dermatology review for skin cancer screening BMI: discussed patient's BMI and encouraged positive lifestyle modifications to help get to or maintain a target BMI. HM needs and immunizations were addressed and ordered. See below for orders. See HM and immunization section for updates. Routine labs and screening tests ordered including cmp, cbc and lipids where appropriate. Discussed recommendations regarding Vit D and calcium supplementation (see AVS) PSVT/palpitations: Stable. Migraine: First episode.  Sounded like classic migraine.  No aura.  Will monitor for symptoms or recurrence.  NSAIDs if recurs.  Follow up: Return in about 1 year (around 10/23/2022) for complete physical.  Commons side effects, risks, benefits, and alternatives for medications and treatment plan prescribed today were discussed, and the patient expressed understanding of the given  instructions. Patient is instructed to call or message via MyChart if he/she has any questions or concerns regarding our treatment plan. No barriers to understanding were identified. We discussed Red Flag symptoms and signs in detail. Patient expressed understanding regarding what to do in case of urgent or emergency type symptoms.  Medication list was reconciled, printed and provided to the patient in AVS. Patient instructions and summary information was reviewed with the patient as documented in the AVS. This note was prepared with assistance of Dragon voice recognition software. Occasional wrong-word or sound-a-like substitutions may have occurred due to the inherent limitations of voice recognition software This visit occurred during the SARS-CoV-2 public health emergency.  Safety protocols were in place, including screening questions prior to the visit, additional usage of staff PPE, and extensive cleaning of exam room while observing appropriate contact time as indicated for disinfecting solutions.   Orders Placed This Encounter  Procedures   CBC with Differential/Platelet   Comp Met (CMET)   Lipid Profile   Hepatitis C Antibody   TSH   No orders of the defined types were placed in this encounter.

## 2021-10-22 NOTE — Patient Instructions (Signed)
Please return in 12 months for your annual complete physical; please come fasting.   So good seeing you!  I will release your lab results to you on your MyChart account with further instructions. You may see the results before I do, but when I review them I will send you a message with my report or have my assistant call you if things need to be discussed. Please reply to my message with any questions. Thank you!   If you have any questions or concerns, please don't hesitate to send me a message via MyChart or call the office at [phone]. Thank you for visiting with [place] today! It's our pleasure caring for you.   Please do these things to maintain good health!  Exercise at least 30-45 minutes a day,  4-5 days a week.  Eat a low-fat diet with lots of fruits and vegetables, up to 7-9 servings per day. Drink plenty of water daily. Try to drink 8 8oz glasses per day. Seatbelts can save your life. Always wear your seatbelt. Place Smoke Detectors on every level of your home and check batteries every year. Eye Doctor - have an eye exam every 1-2 years Safe sex - use condoms to protect yourself from STDs if you could be exposed to these types of infections. Avoid heavy alcohol use. If you drink, keep it to less than 2 drinks/day and not every day. Health Care Power of Attorney.  Choose someone you trust that could speak for you if you became unable to speak for yourself. Depression is common in our stressful world.If you're feeling down or losing interest in things you normally enjoy, please come in for a visit.

## 2021-10-23 LAB — HEPATITIS C ANTIBODY: Hepatitis C Ab: NONREACTIVE

## 2022-01-05 ENCOUNTER — Encounter: Payer: Self-pay | Admitting: *Deleted

## 2022-03-26 ENCOUNTER — Encounter: Payer: Self-pay | Admitting: *Deleted

## 2022-03-27 ENCOUNTER — Encounter: Payer: Self-pay | Admitting: Family Medicine

## 2022-04-02 ENCOUNTER — Ambulatory Visit: Payer: 59 | Admitting: Family

## 2022-04-02 ENCOUNTER — Encounter: Payer: Self-pay | Admitting: Family

## 2022-04-02 VITALS — BP 134/87 | HR 76 | Temp 97.5°F | Ht 66.0 in | Wt 145.6 lb

## 2022-04-02 DIAGNOSIS — M542 Cervicalgia: Secondary | ICD-10-CM

## 2022-04-02 DIAGNOSIS — J3489 Other specified disorders of nose and nasal sinuses: Secondary | ICD-10-CM

## 2022-04-02 MED ORDER — NAPROXEN 500 MG PO TABS: 500.0000 mg | ORAL_TABLET | Freq: Two times a day (BID) | ORAL | 0 refills | Status: DC

## 2022-04-02 NOTE — Progress Notes (Signed)
   Patient ID: Christopher Perkins, male    DOB: 29-Dec-1985, 36 y.o.   MRN: [id number]  Chief Complaint  Patient presents with   Dizziness    Pt c/o lightheadedness since yesterday mostly with activity.     HPI:      Dizziness/head pressure/muscle stiffness:   pt reports he felt tired over the weekend, then has had lightheadedness, with forehead pressure but not a headache per se, reports lack of appetite, also states he has tightness in his left trapezius area up into his neck with pressure on left side of his face/head.Denies any burning pain or tingling. Denies any fever or sinus sx other than pressure, no ear pain or pressure. No recent illness. Wife is [redacted] weeks pregnant w/some nausea otherwise doing well. Denies any increased anxiety, no palpitations (hx of PSVT).  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  Assessment & Plan:  1. Cervicalgia -  sending Naproxen, advised on using bid for next week, along with heat up to tid and analgesic creams/patches tid. Take breaks often when working to stretch. If sx continue will look at PT or Ortho referral.  - naproxen (NAPROSYN) 500 MG tablet; Take 1 tablet (500 mg total) by mouth 2 (two) times daily with a meal. Take bid for at least 1-2 weeks, then prn.  Dispense: 30 tablet; Refill: 0  2. Sinus pressure - left side of head, advised on using generic Flonase, 1 squirt bid for the next 1-2w, then qd (pt has old bottle at home). Advised on using saline nasal spray tid and prior to Flonase. Use humidifier overnight.  Call back if sx are not improved.  Subjective:    No outpatient medications prior to visit.   No facility-administered medications prior to visit.   No past medical history on file. Past Surgical History:  Procedure Laterality Date   HERNIA REPAIR     childhood   WISDOM TOOTH EXTRACTION     No Known Allergies    Objective:    Physical Exam Vitals and nursing  note reviewed.  Constitutional:      General: He is not in acute distress.    Appearance: Normal appearance.  HENT:     Head: Normocephalic.  Cardiovascular:     Rate and Rhythm: Normal rate and regular rhythm.  Pulmonary:     Effort: Pulmonary effort is normal.     Breath sounds: Normal breath sounds.  Musculoskeletal:     Left shoulder: Tenderness (bilateral trapezius, L>R) present.     Cervical back: Muscular tenderness present. Decreased range of motion.  Skin:    General: Skin is warm and dry.  Neurological:     Mental Status: He is alert and oriented to person, place, and time.  Psychiatric:        Mood and Affect: Mood normal.    BP 134/87 (BP Location: Left Arm, Patient Position: Sitting, Cuff Size: Large)   Pulse 76   Temp (!) 97.5 F (36.4 C) (Temporal)   Ht 5\' 6"  (1.676 m)   Wt 145 lb 9.6 oz (66 kg)   SpO2 99%   BMI 23.50 kg/m  Wt Readings from Last 3 Encounters:  04/02/22 145 lb 9.6 oz (66 kg)  10/22/21 144 lb 9.6 oz (65.6 kg)  01/31/20 144 lb 9.6 oz (65.6 kg)       02/02/20, NP

## 2022-04-09 ENCOUNTER — Encounter: Payer: Self-pay | Admitting: Family

## 2022-04-15 NOTE — Telephone Encounter (Signed)
Ask him if the Naproxen helped his neck stiffness at all? He can stop the nasal spray if not helping his head pressure. He should come back in for labs and we can possibly order a head CT. let me know what he says, thx

## 2022-04-24 ENCOUNTER — Ambulatory Visit: Payer: 59 | Admitting: Family

## 2022-04-24 ENCOUNTER — Encounter: Payer: Self-pay | Admitting: Family

## 2022-04-24 VITALS — BP 105/68 | HR 72 | Temp 97.7°F | Ht 66.0 in | Wt 146.2 lb

## 2022-04-24 DIAGNOSIS — T753XXA Motion sickness, initial encounter: Secondary | ICD-10-CM | POA: Diagnosis not present

## 2022-04-24 DIAGNOSIS — R42 Dizziness and giddiness: Secondary | ICD-10-CM

## 2022-04-24 MED ORDER — MECLIZINE HCL 12.5 MG PO TABS: 6.2500 mg | ORAL_TABLET | Freq: Three times a day (TID) | ORAL | 0 refills | Status: DC | PRN

## 2022-04-24 NOTE — Progress Notes (Signed)
   Patient ID: Christopher Perkins, male    DOB: [birth year], 37 y.o.   MRN: [id number]  Chief Complaint  Patient presents with  . Dizziness    Pt states he is still having the same symptoms such as dizziness and nausea when doing activities. Has tried ibuprofen and tylenol which does help sometimes.    HPI:      Pt has had persistent Lightheadedness and nausea mostly brought on by motion for several weeks now. When sitting and resting he does not feel. Seen in the office by me and given Flonase which he is still using but it is not helping sx. Pt flew on plane and sx were worse, states he had to stare straight ahead or he would feel sick. Seen in UC and told left ear had effusion and to add zyrtec-d to the flonase, but caused him to feel weird similar to what sudafed does so he switched to regular zyrtec and tolerating but has not helped sx. Reports he has sx also when he is riding in a car, but not driving.      Assessment & Plan:  1. Orthostatic lightheadedness continue to hydrate and not skip meals. Orthostatics only slightly abnormal.  2. Motion sickness, initial encounter - will send trial of low dose Meclizine, advised on use & SE.     Subjective:    Outpatient Medications Prior to Visit  Medication Sig Dispense Refill  . naproxen (NAPROSYN) 500 MG tablet Take 1 tablet (500 mg total) by mouth 2 (two) times daily with a meal. Take bid for at least 1-2 weeks, then prn. 30 tablet 0   No facility-administered medications prior to visit.   No past medical history on file. Past Surgical History:  Procedure Laterality Date  . HERNIA REPAIR     childhood  . WISDOM TOOTH EXTRACTION     No Known Allergies    Objective:    Physical Exam Vitals and nursing note reviewed.  Constitutional:      General: He is not in acute distress.    Appearance: Normal appearance.  HENT:     Head: Normocephalic.  Cardiovascular:     Rate and Rhythm: Normal rate and regular rhythm.  Pulmonary:      Effort: Pulmonary effort is normal.     Breath sounds: Normal breath sounds.  Musculoskeletal:        General: Normal range of motion.     Cervical back: Normal range of motion.  Skin:    General: Skin is warm and dry.  Neurological:     Mental Status: He is alert and oriented to person, place, and time.  Psychiatric:        Mood and Affect: Mood normal.   BP 105/68 (BP Location: Left Arm, Patient Position: Sitting, Cuff Size: Large)   Pulse 72   Temp 97.7 F (36.5 C) (Temporal)   Ht 5\' 6"  (1.676 m)   Wt 146 lb 3.2 oz (66.3 kg)   SpO2 96%   BMI 23.60 kg/m  Wt Readings from Last 3 Encounters:  04/24/22 146 lb 3.2 oz (66.3 kg)  04/02/22 145 lb 9.6 oz (66 kg)  10/22/21 144 lb 9.6 oz (65.6 kg)       Jeanie Sewer, NP

## 2022-04-26 ENCOUNTER — Encounter: Payer: Self-pay | Admitting: Family

## 2022-04-26 NOTE — Patient Instructions (Signed)
It was very nice to see you today!   I believe your symptoms are stemming from motion sickness and I have sent Meclizine to start taking.  As discussed, I would take this at least twice a day, either 1/2 pill or whole pill for at least 2-3 days and see if improves your symptoms. See the handout attached. Stop the Zyrtec and Flonase. OK to add back Flonase if you do notice sinus pressure, frontal headache, or other symptoms. If the Meclizine is still not helping, I would follow up with Dr. Jonni Sanger to assess further.       PLEASE NOTE:  If you had any lab tests please let us know if you have not heard back within a few days. You may see your results on MyChart before we have a chance to review them but we will give you a call once they are reviewed by [place]. If we ordered any referrals today, please let us know if you have not heard from their office within the next week.

## 2022-05-08 NOTE — Telephone Encounter (Signed)
Pt had an OV with Jeanie Sewer, NP on 04/24/2022.

## 2022-07-24 ENCOUNTER — Telehealth (INDEPENDENT_AMBULATORY_CARE_PROVIDER_SITE_OTHER): Payer: 59 | Admitting: Family Medicine

## 2022-07-24 VITALS — Ht 66.0 in | Wt 145.0 lb

## 2022-07-24 DIAGNOSIS — J301 Allergic rhinitis due to pollen: Secondary | ICD-10-CM | POA: Diagnosis not present

## 2022-07-24 DIAGNOSIS — G43019 Migraine without aura, intractable, without status migrainosus: Secondary | ICD-10-CM | POA: Diagnosis not present

## 2022-07-24 DIAGNOSIS — R42 Dizziness and giddiness: Secondary | ICD-10-CM

## 2022-07-24 MED ORDER — AZELASTINE HCL 0.1 % NA SOLN: 1.0000 | Freq: Two times a day (BID) | NASAL | 5 refills | Status: AC

## 2022-07-24 NOTE — Progress Notes (Unsigned)
Virtual Visit via Video Note  Subjective  CC:  Chief Complaint  Patient presents with   Headache    Mild headaches and constantly out of it like brain fog, lights and sounds hurt his head. This has been going on for the past week with no changes as in hopes of it getting better.      I connected with Christopher Perkins on 07/25/22 at 11:00 AM EDT by a video enabled telemedicine application and verified that I am speaking with the correct person using two identifiers. Location patient: Home Location provider: [facility] [address], Office Persons participating in the virtual visit: Christopher Perkins, Christopher Ora, MD Trudie Reed CMA  I discussed the limitations of evaluation and management by telemedicine and the availability of in person appointments. The patient expressed understanding and agreed to proceed. HPI: Christopher Perkins is a 37 y.o. male who was contacted today to address the problems listed above in the chief complaint. 37 yo male, healthy, complains of 1 week h/o sinus pressure, symmetric w/ associated dull frontal persistent headache. Has some phono and photophobia but no nausea/vomiting. Headache slightly more prominent on left. No neurologic problems. Started claritin and flonase with minimal relief. Does have h/o allergies: has used zyrtec in the past. No thick green nasal discharge and does not feel sick. No dental pain. Appetite is good. No abdominal pain. Headache partly relieved by 2 advil in the am. Has had one episode of typical migraine in the past.  I reviewed most recent office visits: dizziness has resolved.   Assessment  1. Seasonal allergic rhinitis due to pollen   2. Intractable migraine without aura and without status migrainosus   3. Orthostatic lightheadedness      Plan  allergies:  continue flonase, add astelin, change to zyrtec and use advil tid to break headache. Can add mucinex if needed. Monitor for sxs of infection or worsening  headaches.  Migraine: 3 day course of advil recommended.  Dizziness: resolved; likely vertigo.   I discussed the assessment and treatment plan with the patient. The patient was provided an opportunity to ask questions and all were answered. The patient agreed with the plan and demonstrated an understanding of the instructions.   The patient was advised to call back or seek an in-person evaluation if the symptoms worsen or if the condition fails to improve as anticipated. Follow up: July for cpe  10/26/2022  Meds ordered this encounter  Medications   azelastine (ASTELIN) 0.1 % nasal spray    Sig: Place 1 spray into both nostrils 2 (two) times daily.    Dispense:  30 mL    Refill:  5      I reviewed the patients updated PMH, FH, and SocHx.    Patient Active Problem List   Diagnosis Date Noted   Anxiety 04/18/2018   PSVT (paroxysmal supraventricular tachycardia) 04/18/2018   Migraine without aura and without status migrainosus, not intractable 12/02/2017   Current Meds  Medication Sig   azelastine (ASTELIN) 0.1 % nasal spray Place 1 spray into both nostrils 2 (two) times daily.    Allergies: Patient has No Known Allergies. Family History: Patient family history includes Healthy in his father, maternal grandmother, and sister; Heart disease in his maternal grandfather; Hyperlipidemia in his mother. Social History:  Patient  reports that he has never smoked. He has never used smokeless tobacco. He reports that he does not drink alcohol and does not use drugs.  Review of Systems: Constitutional: Negative for fever malaise or anorexia Cardiovascular: negative for chest pain Respiratory: negative for SOB or persistent cough Gastrointestinal: negative for abdominal pain  OBJECTIVE Vitals: Ht  (1.676 m)   Wt 145 lb (65.8 kg)   BMI 23.40 kg/m  General: no acute distress , A&Ox3 HEENT: some congestion, hoarse  Christopher Ora, MD

## 2022-07-24 NOTE — Patient Instructions (Signed)
Please follow up as scheduled for your next visit with me: 10/26/2022   Please change to zyrtec nightly; add the astelin; continue the flonase.  Use advil 2-3 tabs/twice-3x/daily for 2-3 days to knock out the headache. Add Mucinex twice daily IF needed for sinus pressure.   If you have any questions or concerns, please don't hesitate to send me a message via MyChart or call the office at [phone]. Thank you for visiting with [place] today! It's our pleasure caring for you.

## 2022-08-10 ENCOUNTER — Ambulatory Visit: Payer: 59 | Admitting: Family Medicine

## 2022-08-10 ENCOUNTER — Encounter: Payer: Self-pay | Admitting: Family Medicine

## 2022-08-10 VITALS — BP 124/60 | HR 73 | Temp 98.5°F | Ht 66.0 in | Wt 147.0 lb

## 2022-08-10 DIAGNOSIS — J320 Chronic maxillary sinusitis: Secondary | ICD-10-CM | POA: Diagnosis not present

## 2022-08-10 MED ORDER — AMOXICILLIN-POT CLAVULANATE 875-125 MG PO TABS: 1.0000 | ORAL_TABLET | Freq: Two times a day (BID) | ORAL | 0 refills | Status: AC

## 2022-08-10 NOTE — Patient Instructions (Signed)
Please follow up as scheduled for your next visit with me: 10/26/2022   If you have any questions or concerns, please don't hesitate to send me a message via MyChart or call the office at [phone]. Thank you for visiting with [place] today! It's our pleasure caring for you.   Sinus Infection, Adult A sinus infection is soreness and swelling (inflammation) of your sinuses. Sinuses are hollow spaces in the bones around your face. They are located: Around your eyes. In the middle of your forehead. Behind your nose. In your cheekbones. Your sinuses and nasal passages are lined with a fluid called mucus. Mucus drains out of your sinuses. Swelling can trap mucus in your sinuses. This lets germs (bacteria, virus, or fungus) grow, which leads to infection. Most of the time, this condition is caused by a virus. What are the causes? Allergies. Asthma. Germs. Things that block your nose or sinuses. Growths in the nose (nasal polyps). Chemicals or irritants in the air. A fungus. This is rare. What increases the risk? Having a weak body defense system (immune system). Doing a lot of swimming or diving. Using nasal sprays too much. Smoking. What are the signs or symptoms? The main symptoms of this condition are pain and a feeling of pressure around the sinuses. Other symptoms include: Stuffy nose (congestion). This may make it hard to breathe through your nose. Runny nose (drainage). Soreness, swelling, and warmth in the sinuses. A cough that may get worse at night. Being unable to smell and taste. Mucus that collects in the throat or the back of the nose (postnasal drip). This may cause a sore throat or bad breath. Being very tired (fatigued). A fever. How is this diagnosed? Your symptoms. Your medical history. A physical exam. Tests to find out if your condition is short-term (acute) or long-term (chronic). Your doctor may: Check your nose for growths (polyps). Check your sinuses using a  tool that has a light on one end (endoscope). Check for allergies or germs. Do imaging tests, such as an MRI or CT scan. How is this treated? Treatment for this condition depends on the cause and whether it is short-term or long-term. If caused by a virus, your symptoms should go away on their own within 10 days. You may be given medicines to relieve symptoms. They include: Medicines that shrink swollen tissue in the nose. A spray that treats swelling of the nostrils. Rinses that help get rid of thick mucus in your nose (nasal saline washes). Medicines that treat allergies (antihistamines). Over-the-counter pain relievers. If caused by bacteria, your doctor may wait to see if you will get better without treatment. You may be given antibiotic medicine if you have: A very bad infection. A weak body defense system. If caused by growths in the nose, surgery may be needed. Follow these instructions at home: Medicines Take, use, or apply over-the-counter and prescription medicines only as told by your doctor. These may include nasal sprays. If you were prescribed an antibiotic medicine, take it as told by your doctor. Do not stop taking it even if you start to feel better. Hydrate and humidify  Drink enough water to keep your pee (urine) pale yellow. Use a cool mist humidifier to keep the humidity level in your home above 50%. Breathe in steam for 10-15 minutes, 3-4 times a day, or as told by your doctor. You can do this in the bathroom while a hot shower is running. Try not to spend time in cool or dry  air. Rest Rest as much as you can. Sleep with your head raised (elevated). Make sure you get enough sleep each night. General instructions  Put a warm, moist washcloth on your face 3-4 times a day, or as often as told by your doctor. Use nasal saline washes as often as told by your doctor. Wash your hands often with soap and water. If you cannot use soap and water, use hand sanitizer. Do  not smoke. Avoid being around people who are smoking (secondhand smoke). Keep all follow-up visits. Contact a doctor if: You have a fever. Your symptoms get worse. Your symptoms do not get better within 10 days. Get help right away if: You have a very bad headache. You cannot stop vomiting. You have very bad pain or swelling around your face or eyes. You have trouble seeing. You feel confused. Your neck is stiff. You have trouble breathing. These symptoms may be an emergency. Get help right away. Call 911. Do not wait to see if the symptoms will go away. Do not drive yourself to the hospital. Summary A sinus infection is swelling of your sinuses. Sinuses are hollow spaces in the bones around your face. This condition is caused by tissues in your nose that become inflamed or swollen. This traps germs. These can lead to infection. If you were prescribed an antibiotic medicine, take it as told by your doctor. Do not stop taking it even if you start to feel better. Keep all follow-up visits. This information is not intended to replace advice given to you by your health care provider. Make sure you discuss any questions you have with your health care provider. Document Revised: 03/04/2021 Document Reviewed: 03/04/2021 Elsevier Patient Education  2023 [employer].

## 2022-08-10 NOTE — Progress Notes (Signed)
Subjective   CC:  Chief Complaint  Patient presents with   Sore Throat    Pt has had a sore thraot which started today and sinus pressure for the past 2 weeks    HPI: Christopher Perkins is a 37 y.o. male who presents to the office today to address the problems listed above in the chief complaint. Patient reports left constant sinus congestion and pressure without thick drainage, mild nonproductive cough, left-sided ear pressure without pain, left jaw pain, and new left sore throat and mild malaise.  Symptoms have been present for several days.Hedenies high fevers, GI symptoms, shortness of breath. Hehas had sinus infections in the past and this feels similar.  Patient is not a non-smoker.  No history of asthma or COPD.  He had a recent visit for allergies and has been taking his Zyrtec and Astelin.  He is also been using Advil due to the pressure and pain.  I reviewed the patients updated PMH, FH, and SocHx.    Patient Active Problem List   Diagnosis Date Noted   Anxiety 04/18/2018   PSVT (paroxysmal supraventricular tachycardia) 04/18/2018   Migraine without aura and without status migrainosus, not intractable 12/02/2017   Current Meds  Medication Sig   amoxicillin-clavulanate (AUGMENTIN) 875-125 MG tablet Take 1 tablet by mouth 2 (two) times daily for 14 days.   azelastine (ASTELIN) 0.1 % nasal spray Place 1 spray into both nostrils 2 (two) times daily.    Review of Systems: Cardiovascular: negative for chest pain Respiratory: negative for SOB or persistent cough Gastrointestinal: negative for abdominal pain Genitourinary: negative for dysuria or gross hematuria  Objective  Vitals: BP 124/60   Pulse 73   Temp 98.5 F (36.9 C)   Ht 5\' 6"  (1.676 m)   Wt 147 lb (66.7 kg)   SpO2 95%   BMI 23.73 kg/m  General: no acute distress  Psych:  Alert and oriented, normal mood and affect HEENT:  Normocephalic, atraumatic, TMs with serous effusions or retraction w/o erythema, nasal  mucosa is red with purulent drainage, tender maxillary sinus present, OP mild erythematous w/o eudate, supple neck without LAD  Assessment  1. Left maxillary sinusitis      Plan   Sinusitis: History and exam is most consistent with bacterial sinus infection.  Etiology and prognosis discussed with patient.  Recommend antibiotics as ordered below.  Patient to complete course of antibiotics, use supportive medications like mucolytics and decongestants as needed.  May use Tylenol or Advil if needed.  Symptoms should improve over the next 2 weeks.  Patient will return or call if symptoms persist or worsen.  Follow up: No follow-ups on file.   Commons side effects, risks, benefits, and alternatives for medications and treatment plan prescribed today were discussed, and the patient expressed understanding of the given instructions. Patient is instructed to call or message via MyChart if he/she has any questions or concerns regarding our treatment plan. No barriers to understanding were identified. We discussed Red Flag symptoms and signs in detail. Patient expressed understanding regarding what to do in case of urgent or emergency type symptoms.  Medication list was reconciled, printed and provided to the patient in AVS. Patient instructions and summary information was reviewed with the patient as documented in the AVS. This note was prepared with assistance of Dragon voice recognition software. Occasional wrong-word or sound-a-like substitutions may have occurred due to the inherent limitations of voice recognition software  No orders of the defined types were placed  in this encounter.  Meds ordered this encounter  Medications   amoxicillin-clavulanate (AUGMENTIN) 875-125 MG tablet    Sig: Take 1 tablet by mouth 2 (two) times daily for 14 days.    Dispense:  28 tablet    Refill:  0

## 2022-08-21 ENCOUNTER — Ambulatory Visit: Payer: 59 | Admitting: Family Medicine

## 2022-09-01 ENCOUNTER — Ambulatory Visit: Payer: 59 | Admitting: Sports Medicine

## 2022-10-26 ENCOUNTER — Encounter: Payer: 59 | Admitting: Family Medicine

## 2022-12-13 ENCOUNTER — Ambulatory Visit
Admission: EM | Admit: 2022-12-13 | Discharge: 2022-12-13 | Disposition: A | Discharge status: Home / Self Care | Payer: 59 | Attending: Urgent Care | Admitting: Urgent Care

## 2022-12-13 DIAGNOSIS — B349 Viral infection, unspecified: Secondary | ICD-10-CM | POA: Diagnosis not present

## 2022-12-13 DIAGNOSIS — R059 Cough, unspecified: Secondary | ICD-10-CM | POA: Diagnosis present

## 2022-12-13 DIAGNOSIS — Z1152 Encounter for screening for COVID-19: Secondary | ICD-10-CM | POA: Insufficient documentation

## 2022-12-13 MED ORDER — PROMETHAZINE-DM 6.25-15 MG/5ML PO SYRP: 5.0000 mL | ORAL_SOLUTION | Freq: Three times a day (TID) | ORAL | 0 refills | Status: AC | PRN

## 2022-12-13 MED ORDER — CETIRIZINE HCL 10 MG PO TABS: 10.0000 mg | ORAL_TABLET | Freq: Every day | ORAL | 0 refills | Status: AC

## 2022-12-13 MED ORDER — IPRATROPIUM BROMIDE 0.03 % NA SOLN: 2.0000 | Freq: Two times a day (BID) | NASAL | 0 refills | Status: AC

## 2022-12-13 NOTE — ED Triage Notes (Signed)
Pt c/o cough, head/chest congestion, sore throat,fever day 2-last advil ~9am-reports neg covid home test-NAD-steady gait

## 2022-12-13 NOTE — ED Provider Notes (Signed)
[facility]  Note:  This document was prepared using Conservation officer, historic buildings and may include unintentional dictation errors.  MRN: [id number] DOB: 1985/04/29  Subjective:   Josmar Sawin is a 37 y.o. male presenting for 2-day history of sinus congestion, coughing, throat pain, fevers, body aches.  Has used Advil, last dosing was this morning.  Had a negative COVID test at home.  No chest pain, shob, wheezing, chest congestion. No history of respiratory disorders.  No smoking of any kind including cigarettes, cigars, vaping, marijuana use.  No sick exposures.  Has a 65 week old and wants to make sure about testing.   No current facility-administered medications for this encounter.  Current Outpatient Medications:    azelastine (ASTELIN) 0.1 % nasal spray, Place 1 spray into both nostrils 2 (two) times daily., Disp: 30 mL, Rfl: 5   No Known Allergies  History reviewed. No pertinent past medical history.   Past Surgical History:  Procedure Laterality Date   HERNIA REPAIR     childhood   WISDOM TOOTH EXTRACTION      Family History  Problem Relation Age of Onset   Hyperlipidemia Mother    Healthy Father    Healthy Sister    Healthy Maternal Grandmother    Heart disease Maternal Grandfather     Social History   Tobacco Use   Smoking status: Never   Smokeless tobacco: Never  Vaping Use   Vaping status: Never Used  Substance Use Topics   Alcohol use: No   Drug use: No    ROS   Objective:   Vitals: BP (!) 146/82 (BP Location: Right Arm)   Pulse 91   Temp 99.6 F (37.6 C) (Oral)   Resp 16   SpO2 97%   Physical Exam Constitutional:      General: He is not in acute distress.    Appearance: Normal appearance. He is well-developed and normal weight. He is not ill-appearing, toxic-appearing or diaphoretic.  HENT:     Head: Normocephalic and atraumatic.     Right Ear: Tympanic membrane, ear canal and external ear normal. No drainage,  swelling or tenderness. No middle ear effusion. There is no impacted cerumen. Tympanic membrane is not erythematous or bulging.     Left Ear: Tympanic membrane, ear canal and external ear normal. No drainage, swelling or tenderness.  No middle ear effusion. There is no impacted cerumen. Tympanic membrane is not erythematous or bulging.     Nose: Nose normal. No congestion or rhinorrhea.     Mouth/Throat:     Mouth: Mucous membranes are moist.     Pharynx: No oropharyngeal exudate or posterior oropharyngeal erythema.  Eyes:     General: No scleral icterus.       Right eye: No discharge.        Left eye: No discharge.     Extraocular Movements: Extraocular movements intact.     Conjunctiva/sclera: Conjunctivae normal.  Cardiovascular:     Rate and Rhythm: Normal rate and regular rhythm.     Heart sounds: Normal heart sounds. No murmur heard.    No friction rub. No gallop.  Pulmonary:     Effort: Pulmonary effort is normal. No respiratory distress.     Breath sounds: Normal breath sounds. No stridor. No wheezing, rhonchi or rales.  Musculoskeletal:     Cervical back: Normal range of motion and neck supple. No rigidity. No muscular tenderness.  Neurological:     General: No focal  deficit present.     Mental Status: He is alert and oriented to person, place, and time.  Psychiatric:        Mood and Affect: Mood normal.        Behavior: Behavior normal.        Thought Content: Thought content normal.     Assessment and Plan :   PDMP not reviewed this encounter.  1. Acute viral syndrome    Deferred imaging given clear cardiopulmonary exam, hemodynamically stable vital signs. Does not meet [facility] criteria for strep testing.  Will manage for viral illness such as viral URI, viral syndrome, viral rhinitis, COVID-19. Recommended supportive care. Offered scripts for symptomatic relief. Testing is pending. Counseled patient on potential for adverse effects with medications  prescribed/recommended today, ER and return-to-clinic precautions discussed, patient verbalized understanding.     Wallis Bamberg, [place] 12/13/22 [id number]

## 2022-12-13 NOTE — Discharge Instructions (Signed)
We will notify you of your test results as they arrive and may take between about 24 hours.  I encourage you to sign up for MyChart if you have not already done so as this can be the easiest way for [place] to communicate results to you online or through a phone app.  Generally, we only contact you if it is a positive test result.  In the meantime, if you develop worsening symptoms including fever, chest pain, shortness of breath despite our current treatment plan then please report to the emergency room as this may be a sign of worsening status from possible viral infection.  Otherwise, we will manage this as a viral syndrome. For sore throat or cough try using a honey-based tea. Use 3 teaspoons of honey with juice squeezed from half lemon. Place shaved pieces of ginger into 1/2-1 cup of water and warm over stove top. Then mix the ingredients and repeat every 4 hours as needed. Please take Tylenol 650mg  every 6 hours for aches and pains, fevers. Hydrate very well with at least 2 liters of water. Eat light meals such as soups to replenish electrolytes and soft fruits, veggies. Start an antihistamine like Zyrtec (10mg  daily) for postnasal drainage, sinus congestion.  You can take this together with Atrovent 2 times a day as needed for the same kind of congestion.  Use the cough medications as needed.

## 2022-12-14 LAB — SARS CORONAVIRUS 2 (TAT 6-24 HRS): SARS Coronavirus 2: NEGATIVE

## 2022-12-16 ENCOUNTER — Ambulatory Visit: Payer: 59 | Admitting: Family Medicine

## 2022-12-16 VITALS — BP 106/70 | HR 78 | Temp 97.9°F | Ht 66.0 in | Wt 143.8 lb

## 2022-12-16 DIAGNOSIS — J208 Acute bronchitis due to other specified organisms: Secondary | ICD-10-CM | POA: Diagnosis not present

## 2022-12-16 MED ORDER — AZITHROMYCIN 250 MG PO TABS: ORAL_TABLET | ORAL | 0 refills | Status: AC

## 2022-12-16 NOTE — Patient Instructions (Signed)
Please follow up if symptoms do not improve or as needed.    Acute Bronchitis, Adult  Acute bronchitis is sudden inflammation of the main airways (bronchi) that come off the windpipe (trachea) in the lungs. The swelling causes the airways to get smaller and make more mucus than normal. This can make it hard to breathe and can cause coughing or noisy breathing (wheezing). Acute bronchitis may last several weeks. The cough may last longer. Allergies, asthma, and exposure to smoke may make the condition worse. What are the causes? This condition can be caused by germs and by substances that irritate the lungs, including: Cold and flu viruses. The most common cause of this condition is the virus that causes the common cold. Bacteria. This is less common. Breathing in substances that irritate the lungs, including: Smoke from cigarettes and other forms of tobacco. Dust and pollen. Fumes from household cleaning products, gases, or burned fuel. Indoor or outdoor air pollution. What increases the risk? The following factors may make you more likely to develop this condition: A weak body's defense system, also called the immune system. A condition that affects your lungs and breathing, such as asthma. What are the signs or symptoms? Common symptoms of this condition include: Coughing. This may bring up clear, yellow, or green mucus from your lungs (sputum). Wheezing. Runny or stuffy nose. Having too much mucus in your lungs (chest congestion). Shortness of breath. Aches and pains, including sore throat or chest. How is this diagnosed? This condition is usually diagnosed based on: Your symptoms and medical history. A physical exam. You may also have other tests, including tests to rule out other conditions, such as pneumonia. These tests include: A test of lung function. Test of a mucus sample to look for the presence of bacteria. Tests to check the oxygen level in your blood. Blood  tests. Chest X-ray. How is this treated? Most cases of acute bronchitis clear up over time without treatment. Your health care provider may recommend: Drinking more fluids to help thin your mucus so it is easier to cough up. Taking inhaled medicine (inhaler) to improve air flow in and out of your lungs. Using a vaporizer or a humidifier. These are machines that add water to the air to help you breathe better. Taking a medicine that thins mucus and clears congestion (expectorant). Taking a medicine that prevents or stops coughing (cough suppressant). It is not common to take an antibiotic medicine for this condition. Follow these instructions at home:  Take over-the-counter and prescription medicines only as told by your health care provider. Use an inhaler, vaporizer, or humidifier as told by your health care provider. Take two teaspoons (10 mL) of honey at bedtime to lessen coughing at night. Drink enough fluid to keep your urine pale yellow. Do not use any products that contain nicotine or tobacco. These products include cigarettes, chewing tobacco, and vaping devices, such as e-cigarettes. If you need help quitting, ask your health care provider. Get plenty of rest. Return to your normal activities as told by your health care provider. Ask your health care provider what activities are safe for you. Keep all follow-up visits. This is important. How is this prevented? To lower your risk of getting this condition again: Wash your hands often with soap and water for at least 20 seconds. If soap and water are not available, use hand sanitizer. Avoid contact with people who have cold symptoms. Try not to touch your mouth, nose, or eyes with your hands.  Avoid breathing in smoke or chemical fumes. Breathing smoke or chemical fumes will make your condition worse. Get the flu shot every year. Contact a health care provider if: Your symptoms do not improve after 2 weeks. You have trouble  coughing up the mucus. Your cough keeps you awake at night. You have a fever. Get help right away if you: Cough up blood. Feel pain in your chest. Have severe shortness of breath. Faint or keep feeling like you are going to faint. Have a severe headache. Have a fever or chills that get worse. These symptoms may represent a serious problem that is an emergency. Do not wait to see if the symptoms will go away. Get medical help right away. Call your local emergency services (911 in the [place] Do not drive yourself to the hospital. Summary Acute bronchitis is inflammation of the main airways (bronchi) that come off the windpipe (trachea) in the lungs. The swelling causes the airways to get smaller and make more mucus than normal. Drinking more fluids can help thin your mucus so it is easier to cough up. Take over-the-counter and prescription medicines only as told by your health care provider. Do not use any products that contain nicotine or tobacco. These products include cigarettes, chewing tobacco, and vaping devices, such as e-cigarettes. If you need help quitting, ask your health care provider. Contact a health care provider if your symptoms do not improve after 2 weeks. This information is not intended to replace advice given to you by your health care provider. Make sure you discuss any questions you have with your health care provider. Document Revised: 07/10/2021 Document Reviewed: 07/31/2020 Elsevier Patient Education  2024 [employer].

## 2022-12-16 NOTE — Progress Notes (Signed)
Subjective  CC:  Chief Complaint  Patient presents with   Fever    COVID test was taking Sat/Sun and they both negative   Sore Throat    HPI: SUBJECTIVE:  Christopher Perkins is a 37 y.o. male who complains of congestion, nasal blockage, post nasal drip, cough described as productive and denies sinus, high fevers, SOB, chest pain or significant GI symptoms. Symptoms have been present for 5 days.  I reviewed his urgent care note, treated as a viral illness.  He did have low-grade fevers.  Today is the first day where he is starting to feel better.Marland Kitchen He denies a history of anorexia, dizziness, vomiting and wheezing. He denies a history of asthma or COPD. Patient does not smoke cigarettes.  He has a 69-week-old daughter at home now, wants to make sure he is not terribly contagious.  Assessment  1. Acute bronchitis, viral      Plan  Discussion:  Symptoms most consistent with a viral bronchitis.  He is on day 5 of illness.  I gave him a pocket prescription for Z-Pak in case his symptoms worsen or linger greater than 7 to 10 days.  Continue supportive care.  Education regarding differences between viral and bacterial infections and treatment options are discussed.  Supportive care measures are recommended.  We discussed the use of mucolytic's, decongestants, antihistamines and antitussives as needed.  Tylenol or Advil are recommended if needed.  Follow up: As needed  No orders of the defined types were placed in this encounter.  Meds ordered this encounter  Medications   azithromycin (ZITHROMAX) 250 MG tablet    Sig: Take 2 tabs today, then 1 tab daily for 4 days    Dispense:  1 each    Refill:  0      I reviewed the patients updated PMH, FH, and SocHx.  Social History: Patient  reports that he has never smoked. He has never used smokeless tobacco. He reports that he does not drink alcohol and does not use drugs.  Patient Active Problem List   Diagnosis Date Noted   Anxiety 04/18/2018    PSVT (paroxysmal supraventricular tachycardia) 04/18/2018   Migraine without aura and without status migrainosus, not intractable 12/02/2017    Review of Systems: Cardiovascular: negative for chest pain Respiratory: negative for SOB or hemoptysis Gastrointestinal: negative for abdominal pain Genitourinary: negative for dysuria or gross hematuria Current Meds  Medication Sig   azelastine (ASTELIN) 0.1 % nasal spray Place 1 spray into both nostrils 2 (two) times daily.   azithromycin (ZITHROMAX) 250 MG tablet Take 2 tabs today, then 1 tab daily for 4 days   cetirizine (ZYRTEC ALLERGY) 10 MG tablet Take 1 tablet (10 mg total) by mouth daily.   ipratropium (ATROVENT) 0.03 % nasal spray Place 2 sprays into both nostrils 2 (two) times daily.   promethazine-dextromethorphan (PROMETHAZINE-DM) 6.25-15 MG/5ML syrup Take 5 mLs by mouth 3 (three) times daily as needed for cough.    Objective  Vitals: BP 106/70   Pulse 78   Temp 97.9 F (36.6 C)   Ht 5\' 6"  (1.676 m)   Wt 143 lb 12.8 oz (65.2 kg)   SpO2 95%   BMI 23.21 kg/m  General: no acute distress chest congestion audible and coughing, no respiratory distress Psych:  Alert and oriented, normal mood and affect HEENT:  Normocephalic, atraumatic, supple neck, moist mucous membranes,  mild lymphadenopathy, supple neck Cardiovascular:  RRR without murmur. no edema Respiratory:  Good breath sounds bilaterally,  CTAB with normal respiratory effort with occasional rhonchi Skin:  Warm, no rashes Neurologic:   Mental status is normal. normal gait  Commons side effects, risks, benefits, and alternatives for medications and treatment plan prescribed today were discussed, and the patient expressed understanding of the given instructions. Patient is instructed to call or message via MyChart if he/she has any questions or concerns regarding our treatment plan. No barriers to understanding were identified. We discussed Red Flag symptoms and signs in detail.  Patient expressed understanding regarding what to do in case of urgent or emergency type symptoms.  Medication list was reconciled, printed and provided to the patient in AVS. Patient instructions and summary information was reviewed with the patient as documented in the AVS. This note was prepared with assistance of Dragon voice recognition software. Occasional wrong-word or sound-a-like substitutions may have occurred due to the inherent limitations of voice recognition software

## 2023-05-29 IMAGING — DX DG HAND COMPLETE 3+V*R*
3 series · 3 of 3 positions shown · non-contrast
Comparison: None.

CLINICAL DATA: Nail injury.

EXAM:
RIGHT HAND - COMPLETE 3+ VIEW

[hand pa]
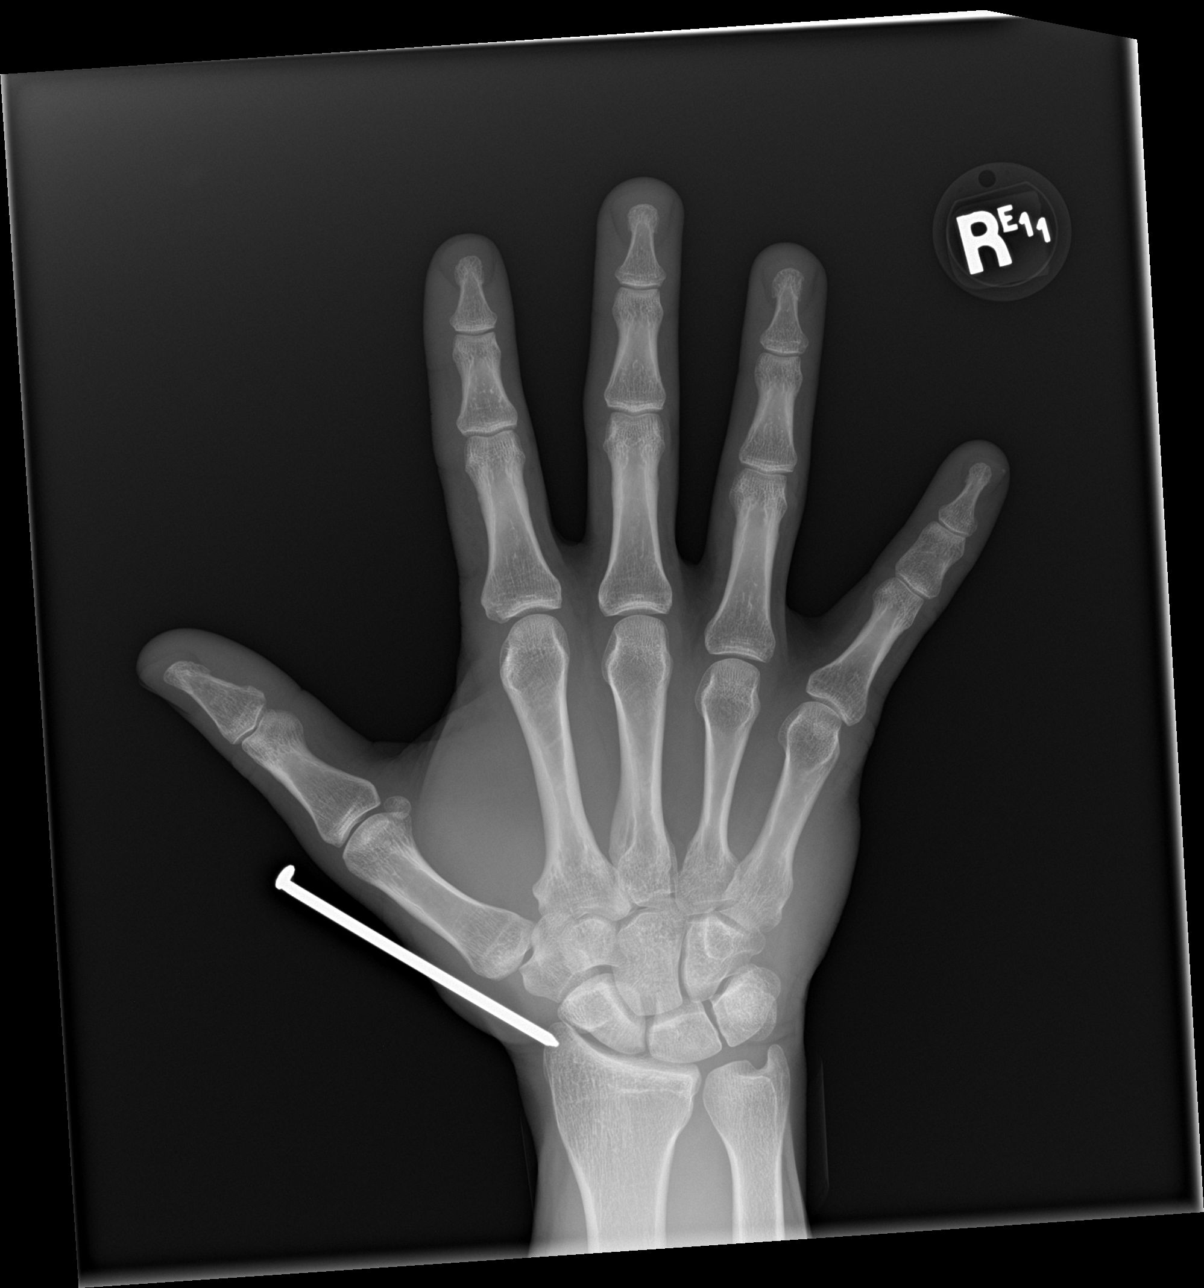

[hand obl]
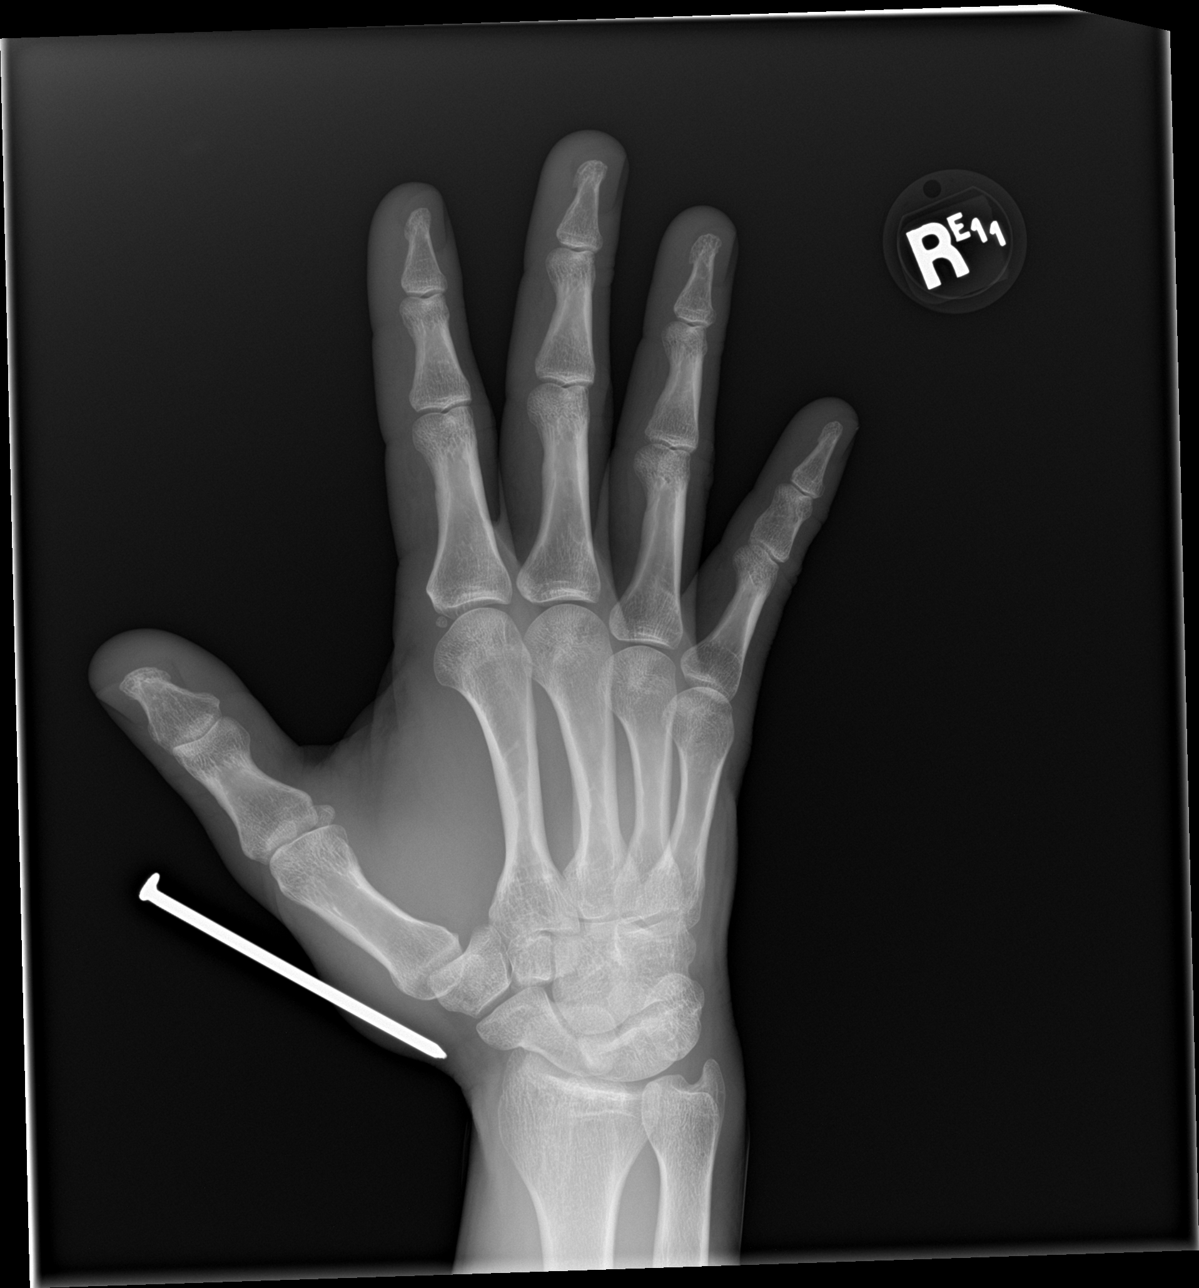

[hand lat]
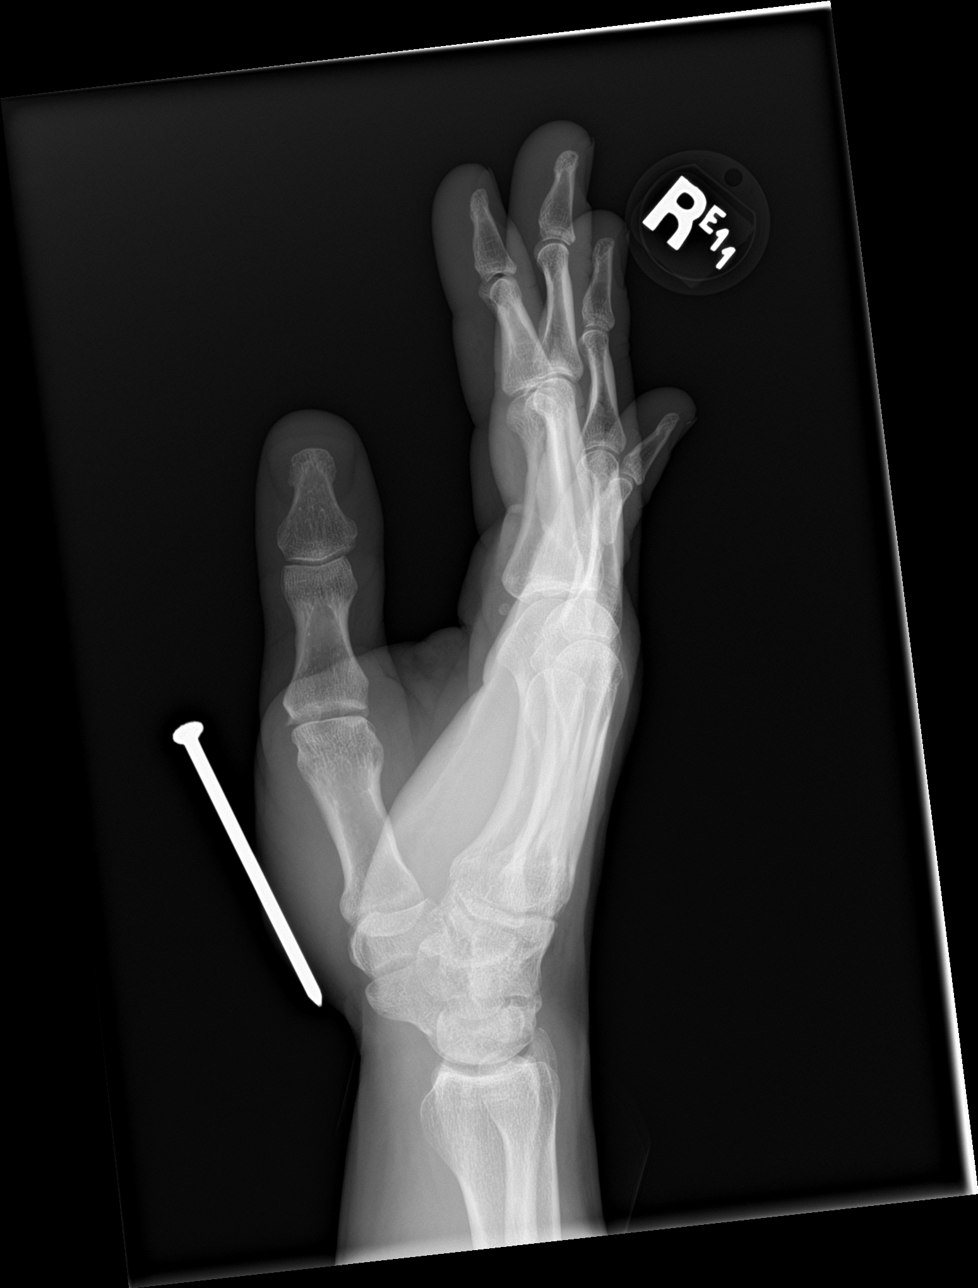

[3 of 3 positions shown; findings below may reference images not displayed]

FINDINGS: There is no evidence of fracture or dislocation. There is no
evidence of arthropathy or other focal bone abnormality. There is a
large nail that passes through the soft tissues adjacent to the
first metacarpal, but does not penetrate bone.
IMPRESSION: Large nail seen in the soft tissues adjacent to first metacarpal. No
bony abnormality is noted.

## 2023-11-18 ENCOUNTER — Encounter: Payer: Self-pay | Admitting: Family Medicine

## 2024-04-18 ENCOUNTER — Encounter
